# Patient Record
Sex: Female | Born: 1995 | Race: White | Hispanic: No | Marital: Single | Smoking: Former smoker
Health system: Southern US, Community
[De-identification: ages and names within clinical notes are randomized; demographics above are authoritative.]

## PROBLEM LIST (undated history)

## (undated) ENCOUNTER — Inpatient Hospital Stay: Payer: Self-pay

## (undated) DIAGNOSIS — F32A Depression, unspecified: Secondary | ICD-10-CM

## (undated) DIAGNOSIS — D649 Anemia, unspecified: Secondary | ICD-10-CM

## (undated) DIAGNOSIS — F419 Anxiety disorder, unspecified: Secondary | ICD-10-CM

## (undated) DIAGNOSIS — F329 Major depressive disorder, single episode, unspecified: Secondary | ICD-10-CM

## (undated) HISTORY — DX: Anemia, unspecified: D64.9

## (undated) HISTORY — DX: Depression, unspecified: F32.A

## (undated) HISTORY — PX: TONSILLECTOMY AND ADENOIDECTOMY: SHX28

## (undated) HISTORY — DX: Anxiety disorder, unspecified: F41.9

## (undated) HISTORY — DX: Major depressive disorder, single episode, unspecified: F32.9

---
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
## 2013-07-13 ENCOUNTER — Inpatient Hospital Stay: Payer: Self-pay | Admitting: Obstetrics and Gynecology

## 2013-07-13 LAB — PIH PROFILE
Anion Gap: 8 (ref 7–16)
BUN: 7 mg/dL (ref 7–18)
Calcium, Total: 8.9 mg/dL — ABNORMAL LOW (ref 9.0–10.7)
Chloride: 105 mmol/L (ref 98–107)
Co2: 23 mmol/L (ref 21–32)
Creatinine: 0.43 mg/dL — ABNORMAL LOW (ref 0.60–1.30)
EGFR (African American): >60
EGFR (Non-African Amer.): >60
Glucose: 78 mg/dL (ref 65–99)
HCT: 35 % (ref 35.0–47.0)
HGB: 12.3 g/dL (ref 12.0–16.0)
MCH: 31.6 pg (ref 26.0–34.0)
MCHC: 35.2 g/dL (ref 32.0–36.0)
MCV: 90 fL (ref 80–100)
Osmolality: 269 (ref 275–301)
Platelet: 237 10*3/uL (ref 150–440)
Potassium: 3.4 mmol/L — ABNORMAL LOW (ref 3.5–5.1)
RBC: 3.9 10*6/uL (ref 3.80–5.20)
RDW: 12.9 % (ref 11.5–14.5)
SGOT(AST): 19 U/L (ref 0–26)
Sodium: 136 mmol/L (ref 136–145)
Uric Acid: 3.7 mg/dL (ref 3.0–5.8)
WBC: 17.1 10*3/uL — ABNORMAL HIGH (ref 3.6–11.0)

## 2013-07-14 LAB — GC/CHLAMYDIA PROBE AMP

## 2013-07-15 LAB — HEMATOCRIT: HCT: 29.2 % — ABNORMAL LOW (ref 35.0–47.0)

## 2014-09-25 LAB — CBC AND DIFFERENTIAL
HCT: 38 % (ref 36–46)
Hemoglobin: 13 g/dL (ref 12.0–16.0)
Platelets: 286 10*3/uL (ref 150–399)
WBC: 7.4 10^3/mL

## 2014-09-25 LAB — HEPATIC FUNCTION PANEL
ALT: 9 U/L (ref 7–35)
AST: 15 U/L (ref 13–35)

## 2014-09-25 LAB — BASIC METABOLIC PANEL
BUN: 12 mg/dL (ref 4–21)
Creatinine: 0.6 mg/dL (ref 0.5–1.1)
Glucose: 90 mg/dL
Potassium: 4.4 mmol/L (ref 3.4–5.3)
Sodium: 140 mmol/L (ref 137–147)

## 2014-09-25 LAB — TSH: TSH: 1.83 u[IU]/mL (ref 0.41–5.90)

## 2015-02-21 ENCOUNTER — Emergency Department: Payer: Self-pay | Admitting: Emergency Medicine

## 2015-02-24 ENCOUNTER — Emergency Department: Payer: Self-pay | Admitting: Emergency Medicine

## 2015-04-22 NOTE — H&P (Signed)
L&D Evaluation:  History:  HPI 19 yo [facility] presenting with contractions.  No LOF, no VB, +FM.  [facility]   Presents with contractions   Patient's Medical History No Chronic Illness   Patient's Surgical History none   Medications Pre Natal Vitamins   Allergies NKDA   Social History none   Family History Non-Contributory   ROS:  ROS All systems were reviewed.  HEENT, CNS, GI, GU, Respiratory, CV, Renal and Musculoskeletal systems were found to be normal.   Exam:  Vital Signs stable   Urine Protein not completed   General no apparent distress   Mental Status clear   Abdomen gravid, non-tender   Estimated Fetal Weight Average for gestational age   Fetal Position vtx   Edema no edema   Pelvic 2/90/-3 per nursings staff slightly more effaced on recheck   Mebranes Intact   FHT normal rate with no decels   Ucx regular   Impression:  Impression early labor   Plan:  Plan monitor contractions and for cervical change   Comments - morphine sleep 10mg  IM once - recheck in 6-8hrs   Electronic Signatures: Lorrene ReidStaebler, Jamarcus Laduke M (MD)  (Signed 01-Aug-14 15:51)  Authored: L&D Evaluation   Last Updated: 01-Aug-14 15:51 by Lorrene ReidStaebler, Tarik Teixeira M (MD)

## 2015-05-28 ENCOUNTER — Other Ambulatory Visit: Payer: Self-pay | Admitting: Family Medicine

## 2015-05-28 DIAGNOSIS — J309 Allergic rhinitis, unspecified: Secondary | ICD-10-CM | POA: Insufficient documentation

## 2015-05-28 DIAGNOSIS — J302 Other seasonal allergic rhinitis: Secondary | ICD-10-CM

## 2015-05-29 ENCOUNTER — Telehealth: Payer: Self-pay | Admitting: Surgery

## 2015-05-29 NOTE — Telephone Encounter (Signed)
Attempted to call pt to sch. For cyst on back of neck per [facility]. Pt has no voice mail on phone

## 2015-05-30 NOTE — Telephone Encounter (Signed)
I have called pt at [phone] to make appt for Epidermal cyst on back of neck, no answer. I have left a VM. I also called [phone], no answer. I was unable to leave a message at this number, states that the caller is not available at this time.

## 2015-09-25 ENCOUNTER — Ambulatory Visit (INDEPENDENT_AMBULATORY_CARE_PROVIDER_SITE_OTHER): Payer: Medicaid Other | Admitting: Physician Assistant

## 2015-09-25 ENCOUNTER — Encounter: Payer: Self-pay | Admitting: Physician Assistant

## 2015-09-25 VITALS — BP 114/78 | HR 97 | Temp 98.6°F | Resp 18 | Ht 63.0 in | Wt 158.4 lb

## 2015-09-25 DIAGNOSIS — D649 Anemia, unspecified: Secondary | ICD-10-CM | POA: Diagnosis not present

## 2015-09-25 DIAGNOSIS — N898 Other specified noninflammatory disorders of vagina: Secondary | ICD-10-CM | POA: Diagnosis not present

## 2015-09-25 DIAGNOSIS — N760 Acute vaginitis: Secondary | ICD-10-CM

## 2015-09-25 DIAGNOSIS — F410 Panic disorder [episodic paroxysmal anxiety] without agoraphobia: Secondary | ICD-10-CM | POA: Insufficient documentation

## 2015-09-25 DIAGNOSIS — Z0000 Encounter for general adult medical examination without abnormal findings: Secondary | ICD-10-CM

## 2015-09-25 DIAGNOSIS — Z30013 Encounter for initial prescription of injectable contraceptive: Secondary | ICD-10-CM | POA: Diagnosis not present

## 2015-09-25 DIAGNOSIS — R87628 Other abnormal cytological findings on specimens from vagina: Secondary | ICD-10-CM | POA: Diagnosis not present

## 2015-09-25 DIAGNOSIS — R41840 Attention and concentration deficit: Secondary | ICD-10-CM

## 2015-09-25 DIAGNOSIS — Z124 Encounter for screening for malignant neoplasm of cervix: Secondary | ICD-10-CM

## 2015-09-25 DIAGNOSIS — F32A Depression, unspecified: Secondary | ICD-10-CM | POA: Insufficient documentation

## 2015-09-25 DIAGNOSIS — Z0001 Encounter for general adult medical examination with abnormal findings: Secondary | ICD-10-CM | POA: Diagnosis not present

## 2015-09-25 DIAGNOSIS — B9689 Other specified bacterial agents as the cause of diseases classified elsewhere: Secondary | ICD-10-CM

## 2015-09-25 DIAGNOSIS — F419 Anxiety disorder, unspecified: Secondary | ICD-10-CM | POA: Insufficient documentation

## 2015-09-25 DIAGNOSIS — Z1239 Encounter for other screening for malignant neoplasm of breast: Secondary | ICD-10-CM

## 2015-09-25 DIAGNOSIS — K529 Noninfective gastroenteritis and colitis, unspecified: Secondary | ICD-10-CM | POA: Insufficient documentation

## 2015-09-25 DIAGNOSIS — F329 Major depressive disorder, single episode, unspecified: Secondary | ICD-10-CM | POA: Insufficient documentation

## 2015-09-25 LAB — POCT WET PREP (WET MOUNT)

## 2015-09-25 MED ORDER — METRONIDAZOLE 500 MG PO TABS: ORAL_TABLET | ORAL | Status: DC

## 2015-09-25 MED ORDER — MEDROXYPROGESTERONE ACETATE 150 MG/ML IM SUSP
150.0000 mg | Freq: Once | INTRAMUSCULAR | Status: AC
  Administered 2015-09-25: 150 mg via INTRAMUSCULAR

## 2015-09-25 NOTE — Patient Instructions (Signed)
Health Maintenance, Female Adopting a healthy lifestyle and getting preventive care can go a long way to promote health and wellness. Talk with your health care provider about what schedule of regular examinations is right for you. This is a good chance for you to check in with your provider about disease prevention and staying healthy. In between checkups, there are plenty of things you can do on your own. Experts have done a lot of research about which lifestyle changes and preventive measures are most likely to keep you healthy. Ask your health care provider for more information. WEIGHT AND DIET  Eat a healthy diet  Be sure to include plenty of vegetables, fruits, low-fat dairy products, and lean protein.  Do not eat a lot of foods high in solid fats, added sugars, or salt.  Get regular exercise. This is one of the most important things you can do for your health.  Most adults should exercise for at least 150 minutes each week. The exercise should increase your heart rate and make you sweat (moderate-intensity exercise).  Most adults should also do strengthening exercises at least twice a week. This is in addition to the moderate-intensity exercise.  Maintain a healthy weight  Body mass index (BMI) is a measurement that can be used to identify possible weight problems. It estimates body fat based on height and weight. Your health care provider can help determine your BMI and help you achieve or maintain a healthy weight.  For females 28 years of age and older:   A BMI below 18.5 is considered underweight.  A BMI of 18.5 to 24.9 is normal.  A BMI of 25 to 29.9 is considered overweight.  A BMI of 30 and above is considered obese.  Watch levels of cholesterol and blood lipids  You should start having your blood tested for lipids and cholesterol at 21 years of age, then have this test every 5 years.  You may need to have your cholesterol levels checked more often if:  Your lipid  or cholesterol levels are high.  You are older than 21 years of age.  You are at high risk for heart disease.  CANCER SCREENING   Lung Cancer  Lung cancer screening is recommended for adults 75-66 years old who are at high risk for lung cancer because of a history of smoking.  A yearly low-dose CT scan of the lungs is recommended for people who:  Currently smoke.  Have quit within the past 15 years.  Have at least a 30-pack-year history of smoking. A pack year is smoking an average of one pack of cigarettes a day for 1 year.  Yearly screening should continue until it has been 15 years since you quit.  Yearly screening should stop if you develop a health problem that would prevent you from having lung cancer treatment.  Breast Cancer  Practice breast self-awareness. This means understanding how your breasts normally appear and feel.  It also means doing regular breast self-exams. Let your health care provider know about any changes, no matter how small.  If you are in your 20s or 30s, you should have a clinical breast exam (CBE) by a health care provider every 1-3 years as part of a regular health exam.  If you are 25 or older, have a CBE every year. Also consider having a breast X-ray (mammogram) every year.  If you have a family history of breast cancer, talk to your health care provider about genetic screening.  If you  are at high risk for breast cancer, talk to your health care provider about having an MRI and a mammogram every year.  Breast cancer gene (BRCA) assessment is recommended for women who have family members with BRCA-related cancers. BRCA-related cancers include:  Breast.  Ovarian.  Tubal.  Peritoneal cancers.  Results of the assessment will determine the need for genetic counseling and BRCA1 and BRCA2 testing. Cervical Cancer Your health care provider may recommend that you be screened regularly for cancer of the pelvic organs (ovaries, uterus, and  vagina). This screening involves a pelvic examination, including checking for microscopic changes to the surface of your cervix (Pap test). You may be encouraged to have this screening done every 3 years, beginning at age 21.  For women ages 30-65, health care providers may recommend pelvic exams and Pap testing every 3 years, or they may recommend the Pap and pelvic exam, combined with testing for human papilloma virus (HPV), every 5 years. Some types of HPV increase your risk of cervical cancer. Testing for HPV may also be done on women of any age with unclear Pap test results.  Other health care providers may not recommend any screening for nonpregnant women who are considered low risk for pelvic cancer and who do not have symptoms. Ask your health care provider if a screening pelvic exam is right for you.  If you have had past treatment for cervical cancer or a condition that could lead to cancer, you need Pap tests and screening for cancer for at least 20 years after your treatment. If Pap tests have been discontinued, your risk factors (such as having a new sexual partner) need to be reassessed to determine if screening should resume. Some women have medical problems that increase the chance of getting cervical cancer. In these cases, your health care provider may recommend more frequent screening and Pap tests. Colorectal Cancer  This type of cancer can be detected and often prevented.  Routine colorectal cancer screening usually begins at 21 years of age and continues through 21 years of age.  Your health care provider may recommend screening at an earlier age if you have risk factors for colon cancer.  Your health care provider may also recommend using home test kits to check for hidden blood in the stool.  A small camera at the end of a tube can be used to examine your colon directly (sigmoidoscopy or colonoscopy). This is done to check for the earliest forms of colorectal  cancer.  Routine screening usually begins at age 50.  Direct examination of the colon should be repeated every 5-10 years through 21 years of age. However, you may need to be screened more often if early forms of precancerous polyps or small growths are found. Skin Cancer  Check your skin from head to toe regularly.  Tell your health care provider about any new moles or changes in moles, especially if there is a change in a mole's shape or color.  Also tell your health care provider if you have a mole that is larger than the size of a pencil eraser.  Always use sunscreen. Apply sunscreen liberally and repeatedly throughout the day.  Protect yourself by wearing long sleeves, pants, a wide-brimmed hat, and sunglasses whenever you are outside. HEART DISEASE, DIABETES, AND HIGH BLOOD PRESSURE   High blood pressure causes heart disease and increases the risk of stroke. High blood pressure is more likely to develop in:  People who have blood pressure in the high end   of the normal range (130-139/85-89 mm Hg).  People who are overweight or obese.  People who are African American.  If you are 38-23 years of age, have your blood pressure checked every 3-5 years. If you are 61 years of age or older, have your blood pressure checked every year. You should have your blood pressure measured twice--once when you are at a hospital or clinic, and once when you are not at a hospital or clinic. Record the average of the two measurements. To check your blood pressure when you are not at a hospital or clinic, you can use:  An automated blood pressure machine at a pharmacy.  A home blood pressure monitor.  If you are between 45 years and 39 years old, ask your health care provider if you should take aspirin to prevent strokes.  Have regular diabetes screenings. This involves taking a blood sample to check your fasting blood sugar level.  If you are at a normal weight and have a low risk for diabetes,  have this test once every three years after 21 years of age.  If you are overweight and have a high risk for diabetes, consider being tested at a younger age or more often. PREVENTING INFECTION  Hepatitis B  If you have a higher risk for hepatitis B, you should be screened for this virus. You are considered at high risk for hepatitis B if:  You were born in a country where hepatitis B is common. Ask your health care provider which countries are considered high risk.  Your parents were born in a high-risk country, and you have not been immunized against hepatitis B (hepatitis B vaccine).  You have HIV or AIDS.  You use needles to inject street drugs.  You live with someone who has hepatitis B.  You have had sex with someone who has hepatitis B.  You get hemodialysis treatment.  You take certain medicines for conditions, including cancer, organ transplantation, and autoimmune conditions. Hepatitis C  Blood testing is recommended for:  Everyone born from [birth year] through 1967.  Anyone with known risk factors for hepatitis C. Sexually transmitted infections (STIs)  You should be screened for sexually transmitted infections (STIs) including gonorrhea and chlamydia if:  You are sexually active and are younger than 21 years of age.  You are older than 21 years of age and your health care provider tells you that you are at risk for this type of infection.  Your sexual activity has changed since you were last screened and you are at an increased risk for chlamydia or gonorrhea. Ask your health care provider if you are at risk.  If you do not have HIV, but are at risk, it may be recommended that you take a prescription medicine daily to prevent HIV infection. This is called pre-exposure prophylaxis (PrEP). You are considered at risk if:  You are sexually active and do not regularly use condoms or know the HIV status of your partner(s).  You take drugs by injection.  You are sexually  active with a partner who has HIV. Talk with your health care provider about whether you are at high risk of being infected with HIV. If you choose to begin PrEP, you should first be tested for HIV. You should then be tested every 3 months for as long as you are taking PrEP.  PREGNANCY   If you are premenopausal and you may become pregnant, ask your health care provider about preconception counseling.  If you may  become pregnant, take 400 to 800 micrograms (mcg) of folic acid every day.  If you want to prevent pregnancy, talk to your health care provider about birth control (contraception). OSTEOPOROSIS AND MENOPAUSE   Osteoporosis is a disease in which the bones lose minerals and strength with aging. This can result in serious bone fractures. Your risk for osteoporosis can be identified using a bone density scan.  If you are 61 years of age or older, or if you are at risk for osteoporosis and fractures, ask your health care provider if you should be screened.  Ask your health care provider whether you should take a calcium or vitamin D supplement to lower your risk for osteoporosis.  Menopause may have certain physical symptoms and risks.  Hormone replacement therapy may reduce some of these symptoms and risks. Talk to your health care provider about whether hormone replacement therapy is right for you.  HOME CARE INSTRUCTIONS   Schedule regular health, dental, and eye exams.  Stay current with your immunizations.   Do not use any tobacco products including cigarettes, chewing tobacco, or electronic cigarettes.  If you are pregnant, do not drink alcohol.  If you are breastfeeding, limit how much and how often you drink alcohol.  Limit alcohol intake to no more than 1 drink per day for nonpregnant women. One drink equals 12 ounces of beer, 5 ounces of wine, or 1 ounces of hard liquor.  Do not use street drugs.  Do not share needles.  Ask your health care provider for help if  you need support or information about quitting drugs.  Tell your health care provider if you often feel depressed.  Tell your health care provider if you have ever been abused or do not feel safe at home.   This information is not intended to replace advice given to you by your health care provider. Make sure you discuss any questions you have with your health care provider.   Document Released: 06/14/2011 Document Revised: 12/20/2014 Document Reviewed: 10/31/2013 Elsevier Interactive Patient Education [employer].

## 2015-09-25 NOTE — Progress Notes (Signed)
Patient: Felicia Daugherty, Female    DOB: 01/16/1996, 19 y.o.   MRN: [id number] Visit Date: 09/25/2015  Today's Provider: Margaretann LovelessJennifer M Burnette, PA-C   Chief Complaint  Patient presents with  . Annual Exam   Subjective:    Annual physical exam Felicia Daugherty is a 19 y.o. female who presents today for health maintenance and complete physical. She feels well. She reports exercising, walks for 30 minutes 5 times a week, sometimes. She reports she is sleeping poorly, hours of sleep 9, but wakes up a lot through the night like 3 times due to her 21 year old son.  She also states she is noticing having more difficulty concentrating on one task at a time and staying focused.  She also has a complaint today of vaginal discharge. She states she did go to urgent care for this a while back and was given metronidazole for bacterial vaginosis but she only took 3 days of the treatment and discontinued. She states that she feels that she has the same thing going on as she did back then and would like to have that checked as well.  Last PCP:06/26/2010 Colonoscopy: 05/23/2012 Normal    Review of Systems  Constitutional: Negative.   HENT: Positive for ear pain.   Eyes: Negative.   Respiratory: Negative.   Cardiovascular: Positive for palpitations (twice a month depends).  Gastrointestinal: Negative.   Endocrine: Negative.   Genitourinary: Positive for vaginal discharge (white discharge).  Musculoskeletal: Negative.   Allergic/Immunologic: Negative.   Hematological: Negative.   Psychiatric/Behavioral: Positive for sleep disturbance and decreased concentration. The patient is nervous/anxious and is hyperactive.     Social History She  reports that she has been smoking.  She does not have any smokeless tobacco history on file. She reports that she does not drink alcohol or use illicit drugs. Social History   Social History  . Marital Status: Single    Spouse Name: N/A  . Number of Children:  N/A  . Years of Education: N/A   Social History Main Topics  . Smoking status: Current Every Day Smoker -- 0.25 packs/day for 3 years  . Smokeless tobacco: None  . Alcohol Use: No  . Drug Use: No  . Sexual Activity: Not Asked   Other Topics Concern  . None   Social History Narrative  . None    Patient Active Problem List   Diagnosis Date Noted  . 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 Anxiety 09/25/2015  . Clinical depression 09/25/2015  . Ileitis 09/25/2015  . Panic attack 09/25/2015  . Discharge from the vagina 09/25/2015  . Allergic rhinitis 05/28/2015  . Encounter for general adult medical examination without abnormal findings 06/26/2010  . Dysmenorrhea 05/13/2010  . Excess, menstruation 05/13/2010  . Encounter for surveillance of contraceptive pills 05/13/2010    Past Surgical History  Procedure Laterality Date  . Tonsillectomy and adenoidectomy      at age 89    Family History  Family Status  Relation Status Death Age  . Mother Alive   . Father Alive   . Brother Alive     h/o kidney stones  . Maternal Grandmother Alive   . Maternal Grandfather Deceased     died from [place] heart failure  . Paternal Grandfather Deceased     died from an MI   Her family history includes Cancer in her maternal grandmother; Hypertension in her father.    No Known Allergies  Previous Medications  No medications on file    Patient Care Team: Lorie Phenix, MD as PCP - General (Family Medicine)     Objective:   Vitals: BP 114/78 mmHg  Pulse 97  Temp(Src) 98.6 F (37 C) (Oral)  Resp 18  Ht  (1.6 m)  Wt 158 lb 6.4 oz (71.85 kg)  BMI 28.07 kg/m2  LMP 09/12/2015   Physical Exam  Constitutional: She is oriented to person, place, and time. She appears well-developed and well-nourished. No distress.  HENT:  Head: Normocephalic and atraumatic.  Right Ear: Hearing, tympanic membrane, external ear and ear canal normal.  Left Ear: Hearing, tympanic membrane, external  ear and ear canal normal.  Nose: Nose normal.  Mouth/Throat: Uvula is midline, oropharynx is clear and moist and mucous membranes are normal. No oropharyngeal exudate.  Eyes: Conjunctivae and EOM are normal. Pupils are equal, round, and reactive to light. Right eye exhibits no discharge. Left eye exhibits no discharge. No scleral icterus.  Neck: Normal range of motion. Neck supple. No JVD present. Carotid bruit is not present. No tracheal deviation present. No thyromegaly present.  Cardiovascular: Normal rate, regular rhythm, normal heart sounds and intact distal pulses.  Exam reveals no gallop and no friction rub.   No murmur heard. Pulmonary/Chest: Effort normal and breath sounds normal. No respiratory distress. She has no wheezes. She has no rales. She exhibits no tenderness. Right breast exhibits no inverted nipple, no mass, no nipple discharge, no skin change and no tenderness. Left breast exhibits no inverted nipple, no mass, no nipple discharge, no skin change and no tenderness. Breasts are symmetrical.  Abdominal: Soft. Bowel sounds are normal. She exhibits no distension and no mass. There is no tenderness. There is no rebound and no guarding. Hernia confirmed negative in the right inguinal area and confirmed negative in the left inguinal area.  Genitourinary: Rectum normal and uterus normal. No breast swelling, tenderness, discharge or bleeding. Pelvic exam was performed with patient supine. There is no rash, tenderness, lesion or injury on the right labia. There is no rash, tenderness, lesion or injury on the left labia. Cervix exhibits no motion tenderness, no discharge and no friability. Right adnexum displays no mass, no tenderness and no fullness. Left adnexum displays no mass, no tenderness and no fullness. No erythema, tenderness or bleeding in the vagina. No signs of injury around the vagina. Vaginal discharge (thick, white, malodorous) found.  Musculoskeletal: Normal range of motion. She  exhibits no edema or tenderness.  Lymphadenopathy:    She has no cervical adenopathy.       Right: No inguinal adenopathy present.       Left: No inguinal adenopathy present.  Neurological: She is alert and oriented to person, place, and time. She has normal reflexes. No cranial nerve deficit. Coordination normal.  Skin: Skin is warm and dry. No rash noted. She is not diaphoretic.  Psychiatric: She has a normal mood and affect. Her behavior is normal. Judgment and thought content normal.  Vitals reviewed.    Depression Screen No flowsheet data found.    Assessment & Plan:     Routine Health Maintenance and Physical Exam  1. Annual physical exam Physical exam was normal today. I will check labs as below being that she has never had her labs checked per her knowledge. She does have a history of absolute anemia that occurred following the birth of her son 2 years ago due to heavy menstruation. I will recheck her blood count to make sure  she is doing okay. I will follow-up with her pending lab results or in 12 months for repeat physical exam if labs are normal. - Comprehensive metabolic panel - Lipid panel - CBC with Differential/Platelet - TSH  2. Breast cancer screening Breast exam was normal today I did explain to the patient and instruct her on how to do monthly self breast exams she voiced understanding.  3. Cervical cancer screening Exam was normal today. She has never tested HPV positive. She has had the guard is still vaccination series. I will check Pap today per patient's request. We'll follow-up pending Pap results. - Pap IG, CT/NG NAA, and HPV (high risk) ([facility])  4. Vaginal discharge Wet prep today was positive for U clue cells. I do feel this is most likely bacterial vaginosis that has continued from her previous infection where she did not initiate treatment completely. We will restart treatment as below. - POCT Wet Prep [employer])  5. BV (bacterial  vaginosis) See above medical treatment plan for vaginal discharge. Will treat with metronidazole as below. She is to call the office if symptoms fail to improve or worsen. - metroNIDAZOLE (FLAGYL) 500 MG tablet; 1 TABLET BY MOUTH TWICE A DAY MAY CAUSE EXCESSIVE VOMITING IF TAKEN WITH ALCOHOL  Dispense: 14 tablet; Refill: 0  7. Encounter for initial prescription of injectable contraceptive She is interested in contraception. She is currently not using any forms of contraception. She has had the nexplanon device previously and had to have it removed due to discomfort. She is not interested in an IUD. She has previously used Depo-Provera for 2 injections and would like to try this again as it worked well before. Depo-Provera was given in the office today without complication. She is to return in 3 months for her next injection. - medroxyPROGESTERone (DEPO-PROVERA) injection 150 mg; Inject 1 mL (150 mg total) into the muscle once.  8. Lack of concentration She is interested in testing for ADHD. I will refer her to psychology for this testing. If she is positive for ADHD she may return here for treatment. - Ambulatory referral to Psychology  Exercise Activities and Dietary recommendations Goals    None       There is no immunization history on file for this patient.  Health Maintenance  Topic Date Due  . HIV Screening  02/16/2009  . TETANUS/TDAP  02/16/2013  . PAP SMEAR  02/17/2015  . INFLUENZA VACCINE  07/14/2015      Discussed health benefits of physical activity, and encouraged her to engage in regular exercise appropriate for her age and condition.

## 2015-09-27 LAB — CBC WITH DIFFERENTIAL/PLATELET
Basophils Absolute: 0.1 10*3/uL (ref 0.0–0.2)
Basos: 2 %
EOS (ABSOLUTE): 0.8 10*3/uL — ABNORMAL HIGH (ref 0.0–0.4)
Eos: 11 %
Hematocrit: 39.8 % (ref 34.0–46.6)
Hemoglobin: 13 g/dL (ref 11.1–15.9)
Immature Grans (Abs): 0 10*3/uL (ref 0.0–0.1)
Immature Granulocytes: 0 %
Lymphocytes Absolute: 2 10*3/uL (ref 0.7–3.1)
Lymphs: 29 %
MCH: 29.6 pg (ref 26.6–33.0)
MCHC: 32.7 g/dL (ref 31.5–35.7)
MCV: 91 fL (ref 79–97)
Monocytes Absolute: 0.5 10*3/uL (ref 0.1–0.9)
Monocytes: 7 %
Neutrophils Absolute: 3.5 10*3/uL (ref 1.4–7.0)
Neutrophils: 51 %
Platelets: 291 10*3/uL (ref 150–379)
RBC: 4.39 x10E6/uL (ref 3.77–5.28)
RDW: 13.8 % (ref 12.3–15.4)
WBC: 6.8 10*3/uL (ref 3.4–10.8)

## 2015-09-27 LAB — COMPREHENSIVE METABOLIC PANEL
ALT: 13 IU/L (ref 0–32)
AST: 14 IU/L (ref 0–40)
Albumin/Globulin Ratio: 1.5 (ref 1.1–2.5)
Albumin: 4.3 g/dL (ref 3.5–5.5)
Alkaline Phosphatase: 65 IU/L (ref 39–117)
BUN/Creatinine Ratio: 23 — ABNORMAL HIGH (ref 8–20)
BUN: 13 mg/dL (ref 6–20)
Bilirubin Total: 0.5 mg/dL (ref 0.0–1.2)
CO2: 23 mmol/L (ref 18–29)
Calcium: 9.3 mg/dL (ref 8.7–10.2)
Chloride: 98 mmol/L (ref 97–108)
Creatinine, Ser: 0.56 mg/dL — ABNORMAL LOW (ref 0.57–1.00)
GFR calc Af Amer: 154 mL/min/{1.73_m2} (ref >59)
GFR calc non Af Amer: 134 mL/min/{1.73_m2} (ref >59)
Globulin, Total: 2.8 g/dL (ref 1.5–4.5)
Glucose: 82 mg/dL (ref 65–99)
Potassium: 4.3 mmol/L (ref 3.5–5.2)
Sodium: 137 mmol/L (ref 134–144)
Total Protein: 7.1 g/dL (ref 6.0–8.5)

## 2015-09-27 LAB — LIPID PANEL
Chol/HDL Ratio: 3.1 ratio units (ref 0.0–4.4)
Cholesterol, Total: 156 mg/dL (ref 100–199)
HDL: 50 mg/dL (ref >39)
LDL Calculated: 88 mg/dL (ref 0–99)
Triglycerides: 88 mg/dL (ref 0–149)
VLDL Cholesterol Cal: 18 mg/dL (ref 5–40)

## 2015-09-27 LAB — TSH: TSH: 3.25 u[IU]/mL (ref 0.450–4.500)

## 2015-09-29 ENCOUNTER — Telehealth: Payer: Self-pay

## 2015-09-29 ENCOUNTER — Telehealth: Payer: Self-pay | Admitting: Physician Assistant

## 2015-09-29 NOTE — Telephone Encounter (Signed)
-----   Message from Margaretann LovelessJennifer M Burnette, PA-C sent at 09/29/2015  9:27 AM EDT ----- All labs are stable and WNL.  Still awaiting pap results.

## 2015-09-29 NOTE — Telephone Encounter (Signed)
Pt is requesting results from lab work.  [id number]#[phone]/[id number]#[phone]/MW

## 2015-09-29 NOTE — Telephone Encounter (Signed)
Patient advised as directed below.  Thanks,  -Joseline 

## 2015-10-01 ENCOUNTER — Telehealth: Payer: Self-pay

## 2015-10-01 LAB — PAP IG, CT-NG NAA, HPV HIGH-RISK
Chlamydia, Nuc. Acid Amp: NEGATIVE
Gonococcus by Nucleic Acid Amp: NEGATIVE
PAP Smear Comment: 0

## 2015-10-01 LAB — HPV, LOW VOLUME (REFLEX): HPV low volume reflex: NEGATIVE

## 2015-10-01 NOTE — Telephone Encounter (Signed)
-----   Message from Margaretann LovelessJennifer M Burnette, PA-C sent at 10/01/2015  5:13 PM EDT ----- Normal pap, negative HPV and GC/Chlamydia

## 2015-10-01 NOTE — Telephone Encounter (Signed)
Advised pt as directed. Pt verbalized fully understanding.  Thanks,   

## 2015-10-10 ENCOUNTER — Encounter: Payer: Self-pay | Admitting: Emergency Medicine

## 2015-10-10 ENCOUNTER — Emergency Department
Admission: EM | Admit: 2015-10-10 | Discharge: 2015-10-10 | Disposition: A | Discharge status: Home / Self Care | Payer: BC Managed Care – PPO | Attending: Emergency Medicine | Admitting: Emergency Medicine

## 2015-10-10 DIAGNOSIS — Z720 Tobacco use: Secondary | ICD-10-CM | POA: Insufficient documentation

## 2015-10-10 DIAGNOSIS — R221 Localized swelling, mass and lump, neck: Secondary | ICD-10-CM | POA: Diagnosis present

## 2015-10-10 DIAGNOSIS — R591 Generalized enlarged lymph nodes: Secondary | ICD-10-CM

## 2015-10-10 DIAGNOSIS — R590 Localized enlarged lymph nodes: Secondary | ICD-10-CM | POA: Diagnosis not present

## 2015-10-10 MED ORDER — PREDNISONE 10 MG (21) PO TBPK: ORAL_TABLET | ORAL | Status: DC

## 2015-10-10 NOTE — ED Notes (Signed)
States she noticed 2 small areas to back of neck  One of them has been there for about 2 months. Increased pain .

## 2015-10-10 NOTE — ED Provider Notes (Signed)
[facility] Emergency Department Provider Note ____________________________________________  Time seen: Approximately 9:46 AM  I have reviewed the triage vital signs and the nursing notes.   HISTORY  Chief Complaint lumps in neck    HPI Felicia Daugherty is a 19 y.o. female who presents to the emergency department for evaluation of 2 lumps on the back of her neck. She states that the one on the left has been there for approximately 2 months, but the mom the right is new. She's been evaluated by her primary care provider for the initial lump and had blood drawn. She reports that it was "normal." Review of immediate medical family history reveals one grandmother with breast cancer and another grandparent deceased after MRI. There is no immediate family history of other cancers that she knows of. She denies recent URI or other illness. She denies all other symptoms with the exception of the 2 lumps on the back of the neck.   Past Medical History  Diagnosis Date  . Anxiety   . Depression   . Anemia     Patient Active Problem List   Diagnosis Date Noted  . 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 Anxiety 09/25/2015  . Clinical depression 09/25/2015  . Ileitis 09/25/2015  . Panic attack 09/25/2015  . Allergic rhinitis 05/28/2015  . Dysmenorrhea 05/13/2010    Past Surgical History  Procedure Laterality Date  . Tonsillectomy and adenoidectomy      at age 89    Current Outpatient Rx  Name  Route  Sig  Dispense  Refill  . metroNIDAZOLE (FLAGYL) 500 MG tablet      1 TABLET BY MOUTH TWICE A DAY MAY CAUSE EXCESSIVE VOMITING IF TAKEN WITH ALCOHOL   14 tablet   0   . predniSONE (STERAPRED UNI-PAK 21 TAB) 10 MG (21) TBPK tablet      Take 6 tablets on day 1 Take 5 tablets on day 2 Take 4 tablets on day 3 Take 3 tablets on day 4 Take 2 tablets on day 5 Take 1 tablet on day 6   21 tablet   0     Allergies Review of patient's allergies indicates no known  allergies.  Family History  Problem Relation Age of Onset  . Hypertension Father   . Cancer Maternal Grandmother     Breast    Social History Social History  Substance Use Topics  . Smoking status: Current Every Day Smoker -- 0.25 packs/day for 3 years  . Smokeless tobacco: None  . Alcohol Use: No    Review of Systems Constitutional: No fever/chills Eyes: No visual changes. ENT: No sore throat. Cardiovascular: Denies chest pain. Respiratory: Denies shortness of breath. Gastrointestinal: No abdominal pain.  No nausea, no vomiting.  No diarrhea.  No constipation. Genitourinary: Negative for dysuria. Musculoskeletal: Negative for back pain. Negative for neck stiffness Skin: Negative for rash. Neurological: Negative for headaches, focal weakness or numbness.  10-point ROS otherwise negative.  ____________________________________________   PHYSICAL EXAM:  VITAL SIGNS: ED Triage Vitals  Enc Vitals Group     BP 10/10/15 0850 130/77 mmHg     Pulse Rate 10/10/15 0850 88     Resp 10/10/15 0850 20     Temp 10/10/15 0850 98.5 F (36.9 C)     Temp Source 10/10/15 0850 Oral     SpO2 10/10/15 0850 100 %     Weight 10/10/15 0850 158 lb (71.668 kg)     Height 10/10/15 0850 5\' 3"  (1.6 m)  Head Cir --      Peak Flow --      Pain Score 10/10/15 0850 4     Pain Loc --      Pain Edu? --      Excl. in GC? --     Constitutional: Alert and oriented. Well appearing and in no acute distress. Eyes: Conjunctivae are normal. PERRL. EOMI. Head: Atraumatic. Nose: No congestion/rhinnorhea. Mouth/Throat: Mucous membranes are moist.  Oropharynx non-erythematous. Neck: No stridor.  2 small nodules noted 1 each side of the posterior aspect of the neck. They are not raised above the skin and are not erythematous or fluctuant. There is no associated induration. Cardiovascular: Normal rate, regular rhythm. Grossly normal heart sounds.  Good peripheral circulation. Respiratory: Normal  respiratory effort.  No retractions. Lungs CTAB. Gastrointestinal: Soft and nontender. No distention. No abdominal bruits. No CVA tenderness. Musculoskeletal: No lower extremity tenderness nor edema.  No joint effusions. Neurologic:  Normal speech and language. No gross focal neurologic deficits are appreciated. No gait instability. Skin:  Skin is warm, dry and intact. No rash noted. Psychiatric: Mood and affect are normal. Speech and behavior are normal.  ____________________________________________   LABS (all labs ordered are listed, but only abnormal results are displayed)  Labs Reviewed - No data to display ____________________________________________  EKG   ____________________________________________  RADIOLOGY  Not indicated ____________________________________________   PROCEDURES  Procedure(s) performed: None  Critical Care performed: No  ____________________________________________   INITIAL IMPRESSION / ASSESSMENT AND PLAN / ED COURSE  Pertinent labs & imaging results that were available during my care of the patient were reviewed by me and considered in my medical decision making (see chart for details).  Reassurance given to the patient. She was advised to take the prednisone pack as prescribed. She was advised to follow-up with her primary care provider for further evaluation and workup if she does not improve after the tapering dose of prednisone. She was advised to return to the emergency department for symptoms that change or worsen if she is unable to schedule an appointment with her PCP. ____________________________________________   FINAL CLINICAL IMPRESSION(S) / ED DIAGNOSES  Final diagnoses:  Lymphadenopathy of head and neck      Chinita Pester, FNP 10/10/15 1610  Phineas Semen, MD 10/10/15 1146

## 2015-10-10 NOTE — Discharge Instructions (Signed)

## 2015-10-10 NOTE — ED Notes (Signed)
Pt to ed with c/o "lumps in the back of my neck"  Pt states areas have been there for 2 months.

## 2015-10-15 ENCOUNTER — Encounter: Payer: Self-pay | Admitting: Family Medicine

## 2015-10-15 ENCOUNTER — Ambulatory Visit (INDEPENDENT_AMBULATORY_CARE_PROVIDER_SITE_OTHER): Payer: BC Managed Care – PPO | Admitting: Family Medicine

## 2015-10-15 VITALS — BP 112/72 | HR 92 | Temp 98.5°F | Resp 16 | Wt 163.0 lb

## 2015-10-15 DIAGNOSIS — J302 Other seasonal allergic rhinitis: Secondary | ICD-10-CM

## 2015-10-15 DIAGNOSIS — F410 Panic disorder [episodic paroxysmal anxiety] without agoraphobia: Secondary | ICD-10-CM

## 2015-10-15 DIAGNOSIS — F329 Major depressive disorder, single episode, unspecified: Secondary | ICD-10-CM | POA: Diagnosis not present

## 2015-10-15 DIAGNOSIS — F32A Depression, unspecified: Secondary | ICD-10-CM

## 2015-10-15 DIAGNOSIS — F39 Unspecified mood [affective] disorder: Secondary | ICD-10-CM | POA: Diagnosis not present

## 2015-10-15 MED ORDER — FLUTICASONE PROPIONATE 50 MCG/ACT NA SUSP: 2.0000 | Freq: Every day | NASAL | Status: DC

## 2015-10-15 NOTE — Progress Notes (Signed)
Subjective:    Patient ID: Felicia Daugherty, female    DOB: 07/29/1996, 19 y.o.   MRN: [id number]  HPI   Follow up Hospitalization  Patient was admitted to [facility] ED on 10/10/2015 and discharged on same day. She was treated for lymphadenopathy.   Treatment for this included Prednisone. Did not help.  Made her feel horrible.  She reports good compliance with treatment. She reports this condition is Unchanged.   Lymphadenopathy Pt reports she has 2 lumps on the back of her neck. Pt has had the left sided lump x 2 months, and this is unchanged in size. Pt reports she has head/neck pain. The right sided lump appeared about 1 week ago. Pt was prescribed Prednisone by the ED for this problem, without relief.  Saw Jenni. Did not think  Lymph node.   Neck felt like you needed to pop it. And it felt better.    Does have previous history of anxiety. Does know that trust is an issue. Thinks there may be something wrong with her.  May have had surgery next week. Is having a neck lift. Does not like how her neck is configured. Has paid for this.      Review of Systems  Constitutional: Positive for fatigue. Negative for fever, chills, diaphoresis, activity change, appetite change and unexpected weight change.  Respiratory: Negative for cough and shortness of breath.   Cardiovascular: Negative for chest pain, palpitations and leg swelling.  Musculoskeletal: Positive for back pain and neck pain.  Neurological: Positive for headaches.  Hematological: Positive for adenopathy.     Patient Active Problem List   Diagnosis Date Noted  . 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 Anxiety 09/25/2015  . Clinical depression 09/25/2015  . Ileitis 09/25/2015  . Panic attack 09/25/2015  . Allergic rhinitis 05/28/2015  . Dysmenorrhea 05/13/2010   Past Medical History  Diagnosis Date  . Anxiety   . Depression   . Anemia    No current outpatient prescriptions on file prior to visit.   No current  facility-administered medications on file prior to visit.   No Known Allergies Past Surgical History  Procedure Laterality Date  . Tonsillectomy and adenoidectomy      at age 89   Social History   Social History  . Marital Status: Single    Spouse Name: N/A  . Number of Children: N/A  . Years of Education: N/A   Occupational History  . Not on file.   Social History Main Topics  . Smoking status: Former Smoker -- 0.25 packs/day for 3 years    Quit date: 10/01/2015  . Smokeless tobacco: Never Used  . Alcohol Use: Yes     Comment: occasional  . Drug Use: No  . Sexual Activity: Not on file   Other Topics Concern  . Not on file   Social History Narrative   Family History  Problem Relation Age of Onset  . Hypertension Father   . Cancer Maternal Grandmother     Breast  . Hypothyroidism Maternal Grandmother   . Lupus Maternal Grandmother   . Hypothyroidism Mother   . Hypothyroidism Brother   . Hypothyroidism Sister   . Lupus Maternal Aunt       Objective:   Physical Exam  Constitutional: She is oriented to person, place, and time. She appears well-developed and well-nourished.  HENT:  Head: Normocephalic and atraumatic.  Right Ear: External ear normal.  Left Ear: External ear normal.  Mouth/Throat: Oropharynx is clear and moist.  Eyes: Conjunctivae and EOM are normal. Pupils are equal, round, and reactive to light.  Neck: Normal range of motion. Neck supple.  Cardiovascular: Normal rate and regular rhythm.   Pulmonary/Chest: Effort normal and breath sounds normal.  Neurological: She is alert and oriented to person, place, and time.  Psychiatric: She has a normal mood and affect. Her behavior is normal. Judgment and thought content normal.   BP 112/72 mmHg  Pulse 92  Temp(Src) 98.5 F (36.9 C) (Oral)  Resp 16  Wt 163 lb (73.936 kg)  LMP 09/12/2015     Assessment & Plan:  1. Clinical depression Stable  Not currently on medication. Feels more like anxiety.     2. Panic attack Is having anxiety.   Stressed about her healthy.    3. Mood disorder (HCC) Does have mood swings, emotional lability. Is actually spending 6000 dollars on cosmetic procedure next week. Did not think this was a good idea in light of her health concerns and her obsessive behavior around them, with frequent trips to ER and UC for "lumps". Exam normal today. Reassurance given. Will refer to Psychiatry to obtain appropriate diagnosis and treatment. Will need mood stabilizer.  - Ambulatory referral to Psychiatry  4. Other seasonal allergic rhinitis Some ETD. Will treat with Flonase. Patient instructed to call back if condition worsens or does not improve.    - fluticasone (FLONASE) 50 MCG/ACT nasal spray; Place 2 sprays into both nostrils daily.  Dispense: 16 g; Refill: 6  Lorie Phenix, MD

## 2015-10-18 ENCOUNTER — Encounter: Payer: Self-pay | Admitting: Family Medicine

## 2015-10-18 ENCOUNTER — Ambulatory Visit (INDEPENDENT_AMBULATORY_CARE_PROVIDER_SITE_OTHER): Payer: BC Managed Care – PPO | Admitting: Family Medicine

## 2015-10-18 DIAGNOSIS — K1379 Other lesions of oral mucosa: Secondary | ICD-10-CM | POA: Diagnosis not present

## 2015-10-18 DIAGNOSIS — K136 Irritative hyperplasia of oral mucosa: Secondary | ICD-10-CM | POA: Insufficient documentation

## 2015-10-18 NOTE — Patient Instructions (Signed)
Discussed swishing with warm salt water (1/2 teaspoon in 4 oz of water) and 1/4 teaspoon of hydrogen peroxide once or twice a day.

## 2015-10-18 NOTE — Progress Notes (Signed)
Subjective:     Patient ID: Felicia Daugherty, female   DOB: 06/07/1996, [age] y.o.   MRN: [id number]  HPI  Chief Complaint  Patient presents with  . Mouth Lesions    Pt noticed a tender "red spot" on the left side of her cheek.  States it has improved since onset 11/3 with the use of saline rinses. Concerned that she may have a cancer. Denies injury. No use of smokeless tobacco. Accompanied by her boyfriend.   Review of Systems     Objective:   Physical Exam  Constitutional: She appears well-developed and well-nourished. No distress.  HENT:  Left buccal mucosa with a non-elevated, .5 cm area of mild erythema with several punctate areas of hemorrhage.       Assessment:    1. Irritation of oral cavity    Plan:    Discussed use of saline and H202 rinses. Reassured that this should resolve in a short period of time.

## 2015-11-17 ENCOUNTER — Ambulatory Visit: Payer: Medicaid Other | Admitting: Licensed Clinical Social Worker

## 2015-11-26 ENCOUNTER — Ambulatory Visit: Payer: Medicaid Other | Admitting: Psychiatry

## 2015-12-16 ENCOUNTER — Ambulatory Visit: Payer: Medicaid Other | Admitting: Physician Assistant

## 2015-12-16 ENCOUNTER — Ambulatory Visit (INDEPENDENT_AMBULATORY_CARE_PROVIDER_SITE_OTHER): Payer: BC Managed Care – PPO

## 2015-12-16 ENCOUNTER — Encounter: Payer: Self-pay | Admitting: Family Medicine

## 2015-12-16 ENCOUNTER — Encounter: Payer: Self-pay | Admitting: Physician Assistant

## 2015-12-16 VITALS — BP 98/60 | HR 100 | Temp 98.2°F | Resp 16 | Wt 155.2 lb

## 2015-12-16 DIAGNOSIS — A499 Bacterial infection, unspecified: Secondary | ICD-10-CM | POA: Diagnosis not present

## 2015-12-16 DIAGNOSIS — N644 Mastodynia: Secondary | ICD-10-CM

## 2015-12-16 DIAGNOSIS — N760 Acute vaginitis: Secondary | ICD-10-CM

## 2015-12-16 DIAGNOSIS — J0120 Acute ethmoidal sinusitis, unspecified: Secondary | ICD-10-CM | POA: Diagnosis not present

## 2015-12-16 DIAGNOSIS — B9689 Other specified bacterial agents as the cause of diseases classified elsewhere: Secondary | ICD-10-CM

## 2015-12-16 DIAGNOSIS — Z3042 Encounter for surveillance of injectable contraceptive: Secondary | ICD-10-CM | POA: Diagnosis not present

## 2015-12-16 MED ORDER — AMOXICILLIN-POT CLAVULANATE 875-125 MG PO TABS: 1.0000 | ORAL_TABLET | Freq: Two times a day (BID) | ORAL | Status: DC

## 2015-12-16 MED ORDER — METRONIDAZOLE 500 MG PO TABS: 500.0000 mg | ORAL_TABLET | Freq: Two times a day (BID) | ORAL | Status: DC

## 2015-12-16 MED ORDER — MEDROXYPROGESTERONE ACETATE 150 MG/ML IM SUSP
150.0000 mg | Freq: Once | INTRAMUSCULAR | Status: AC
  Administered 2015-12-16: 150 mg via INTRAMUSCULAR

## 2015-12-16 NOTE — Progress Notes (Signed)
Nurse Visit for Depo-Provera. Patient is to return for next injection between Mar 20- Apr 3,2017.

## 2015-12-16 NOTE — Progress Notes (Signed)
Patient: Felicia Daugherty Female    DOB: Feb 05, 1996   19 y.o.   MRN: [id number] Visit Date: 12/16/2015  Today's Provider: Margaretann Loveless, PA-C   Chief Complaint  Patient presents with  . Sinusitis  . soreness of breast   Subjective:    Sinusitis This is a new problem. The current episode started 1 to 4 weeks ago. The problem has been gradually worsening since onset. There has been no fever. Associated symptoms include congestion, coughing, ear pain (a little ), headaches, sinus pressure, sneezing and a sore throat. Pertinent negatives include no chills or shortness of breath.   Breast Tender: Patient was here for her Depo-Provera which it was administered. Patient then stated that wanted to talk to provider about sinusitis and Breast feeling sore. Per patient has been spotting and breast are tender. Doesn't know if it is a side effect from the injection. The reason patient is concerns is because breast cancer runs in the family and is scared that she may develop cancer. Per patient is also taking Phentermine for weight loss has been taking it for three weeks.     No Known Allergies Previous Medications   FLUTICASONE (FLONASE) 50 MCG/ACT NASAL SPRAY    Place 2 sprays into both nostrils daily.    Review of Systems  Constitutional: Negative for fever, chills and fatigue.  HENT: Positive for congestion, ear pain (a little ), postnasal drip, rhinorrhea, sinus pressure, sneezing and sore throat. Negative for tinnitus, trouble swallowing and voice change.   Eyes: Negative.   Respiratory: Positive for cough. Negative for chest tightness, shortness of breath and wheezing.   Cardiovascular: Negative for chest pain.  Gastrointestinal: Negative for nausea, vomiting and abdominal pain.  Neurological: Positive for headaches. Negative for dizziness.  All other systems reviewed and are negative.   Social History  Substance Use Topics  . Smoking status: Former Smoker -- 0.25 packs/day  for 3 years    Quit date: 10/01/2015  . Smokeless tobacco: Never Used  . Alcohol Use: Yes     Comment: occasional   Objective:   BP 98/60 mmHg  Pulse 100  Temp(Src) 98.2 F (36.8 C) (Oral)  Resp 16  Wt 155 lb 3.2 oz (70.398 kg)  SpO2 99%  Physical Exam  Constitutional: She appears well-developed and well-nourished. No distress.  HENT:  Head: Normocephalic and atraumatic.  Right Ear: Hearing, tympanic membrane, external ear and ear canal normal. Tympanic membrane is not erythematous and not bulging. No middle ear effusion.  Left Ear: Hearing, external ear and ear canal normal. Tympanic membrane is not erythematous and not bulging. A middle ear effusion is present.  Nose: Mucosal edema present. No rhinorrhea. Right sinus exhibits no maxillary sinus tenderness and no frontal sinus tenderness. Left sinus exhibits no maxillary sinus tenderness and no frontal sinus tenderness.  Mouth/Throat: Uvula is midline, oropharynx is clear and moist and mucous membranes are normal. No oropharyngeal exudate, posterior oropharyngeal edema or posterior oropharyngeal erythema.  Tenderness over ethmoidal sinuses  Eyes: Conjunctivae are normal. Pupils are equal, round, and reactive to light. Right eye exhibits no discharge. Left eye exhibits no discharge. No scleral icterus.  Neck: Normal range of motion. Neck supple. No tracheal deviation present. No thyromegaly present.  Cardiovascular: Normal rate, regular rhythm and normal heart sounds.  Exam reveals no gallop and no friction rub.   No murmur heard. Pulmonary/Chest: Effort normal and breath sounds normal. No accessory muscle usage or stridor. No respiratory distress.  She has no wheezes. She has no rhonchi. She has no rales. She exhibits no tenderness and no bony tenderness.  Lymphadenopathy:    She has no cervical adenopathy.  Skin: Skin is warm and dry. She is not diaphoretic.  Vitals reviewed.       Assessment & Plan:     1. Acute ethmoidal  sinusitis, recurrence not specified Worsening symptoms over the last 3 weeks without response to over-the-counter medications. I will treat with Augmentin as below. I also advised to make sure to try to get plenty of rest and to stay well-hydrated. She is to call the office if symptoms fail to improve or worsen. - amoxicillin-clavulanate (AUGMENTIN) 875-125 MG tablet; Take 1 tablet by mouth 2 (two) times daily.  Dispense: 20 tablet; Refill: 0  2. BV (bacterial vaginosis) During interview patient states that on her last pelvic exam she was diagnosed with bacterial vaginosis. She did not complete the metronidazole that was given to her and feels that the symptoms never completely went away. She is having similar symptoms to what she had previously. I will treat with Flagyl as below. I did advise her to return to make sure to complete her treatment to make sure that the bacteria is completely cleared. She voiced understanding and states she will try to do better this time. I did discuss with her the bad outcomes packing comment from untreated bacterial vaginosis including PID. She states that she will complete treatment this time. She is to call the office if symptoms fail to improve or worsen. - metroNIDAZOLE (FLAGYL) 500 MG tablet; Take 1 tablet (500 mg total) by mouth 2 (two) times daily.  Dispense: 14 tablet; Refill: 0  3. Breast tenderness in female I do feel her breast tenderness is secondary to beginning Depo-Provera. Today was only her second injection. I did discuss with her that the body goes through a period of adjustment with the new hormones and that this can cause spotting and breast tenderness as well as other symptoms. She voiced understanding. I did advise her however that if she continues to have similar symptoms after the next couple injections that she should definitely let us know so they can be evaluated further.       Margaretann LovelessJennifer M Burnette, PA-C  [facility]

## 2015-12-16 NOTE — Patient Instructions (Signed)

## 2015-12-19 ENCOUNTER — Telehealth: Payer: Self-pay | Admitting: Family Medicine

## 2015-12-19 DIAGNOSIS — J0140 Acute pansinusitis, unspecified: Secondary | ICD-10-CM

## 2015-12-19 MED ORDER — DOXYCYCLINE HYCLATE 100 MG PO TABS: 100.0000 mg | ORAL_TABLET | Freq: Two times a day (BID) | ORAL | Status: DC

## 2015-12-19 NOTE — Telephone Encounter (Signed)
She may stop the Augmentin and will try doxycycline. She is to call the office if this causes upset stomach as well.

## 2015-12-19 NOTE — Telephone Encounter (Signed)
Patient advised as directed below.  Thanks,  -Estoria Geary 

## 2015-12-19 NOTE — Telephone Encounter (Signed)
Pt was in earlier this week and was given an antibiotic.  She says that it is making her stomach hurt and cramp.  Her call back is [phone]  Thanks Barth Kirkseri

## 2016-01-19 ENCOUNTER — Encounter: Payer: Self-pay | Admitting: Physician Assistant

## 2016-01-19 ENCOUNTER — Ambulatory Visit (INDEPENDENT_AMBULATORY_CARE_PROVIDER_SITE_OTHER): Payer: BC Managed Care – PPO | Admitting: Physician Assistant

## 2016-01-19 VITALS — BP 120/70 | HR 110 | Temp 98.2°F | Resp 16 | Wt 152.0 lb

## 2016-01-19 DIAGNOSIS — R221 Localized swelling, mass and lump, neck: Secondary | ICD-10-CM | POA: Diagnosis not present

## 2016-01-19 NOTE — Progress Notes (Signed)
       Patient: Felicia Daugherty Female    DOB: 1996-06-26   19 y.o.   MRN: [id number] Visit Date: 01/19/2016  Today's Provider: Margaretann Loveless, PA-C   Chief Complaint  Patient presents with  . Nodules in Neck   Subjective:    HPI Felicia Daugherty is here concerned about nodules on the right side of her neck. No change in size, not swollen, no redness. It feels like there's a little bit of pressure. No sore throat, not exposed to mono. She states that she noticed it approximately one month ago and is worried that it may be cancer. Is located close to the posterior aspect on the right side of the neck posterior to the sternocleidomastoid. She does have a history of liposuction on her neck.     No Known Allergies Previous Medications   FLUTICASONE (FLONASE) 50 MCG/ACT NASAL SPRAY    Place 2 sprays into both nostrils daily.   OMEGA-3 FATTY ACIDS (OMEGA 3 PO)    Take by mouth daily.   PHENTERMINE 37.5 MG CAPSULE    Take 37.5 mg by mouth every morning.    Review of Systems  Constitutional: Negative for fever and fatigue.  HENT: Positive for sinus pressure. Negative for ear pain, sore throat, trouble swallowing and voice change.   Respiratory: Negative for cough, chest tightness, shortness of breath and wheezing.   Cardiovascular: Negative for chest pain and palpitations.  Gastrointestinal: Negative for nausea, vomiting and abdominal pain.    Social History  Substance Use Topics  . Smoking status: Former Smoker -- 0.25 packs/day for 3 years    Quit date: 10/01/2015  . Smokeless tobacco: Never Used  . Alcohol Use: Yes     Comment: occasional   Objective:   BP 120/70 mmHg  Pulse 110  Temp(Src) 98.2 F (36.8 C) (Oral)  Resp 16  Wt 152 lb (68.947 kg)  Physical Exam  Constitutional: She appears well-developed and well-nourished. No distress.  HENT:  Head: Normocephalic and atraumatic.  Right Ear: Tympanic membrane and external ear normal.  Left Ear: Tympanic membrane and  external ear normal.  Nose: Right sinus exhibits no maxillary sinus tenderness and no frontal sinus tenderness. Left sinus exhibits no maxillary sinus tenderness and no frontal sinus tenderness.  Mouth/Throat: Uvula is midline, oropharynx is clear and moist and mucous membranes are normal. No oropharyngeal exudate, posterior oropharyngeal edema or posterior oropharyngeal erythema.  Neck: Normal range of motion. Neck supple. No tracheal deviation present. No thyromegaly present.    Lymphadenopathy:    She has no cervical adenopathy.  Skin: She is not diaphoretic.  Vitals reviewed.       Assessment & Plan:     1. Mass in neck Palpable, nontender, movable mass located on the right side of the neck. This does feel most consistent with a cyst but due to family history of cancer and her concerns we will get an ultrasound to further evaluate this area to make sure that that is what it is. I will follow-up with her pending the results of the ultrasound. She is to call the office in the meantime if this area gets larger, becomes tender or if she develops any other acute issues, question or concern. - US Soft Tissue Head/Neck; Future       Margaretann Loveless, PA-C  [facility]

## 2016-01-23 ENCOUNTER — Ambulatory Visit
Admission: RE | Admit: 2016-01-23 | Discharge: 2016-01-23 | Disposition: A | Discharge status: Home / Self Care | Payer: BC Managed Care – PPO | Source: Ambulatory Visit | Attending: Physician Assistant | Admitting: Physician Assistant

## 2016-01-23 ENCOUNTER — Telehealth: Payer: Self-pay

## 2016-01-23 DIAGNOSIS — R221 Localized swelling, mass and lump, neck: Secondary | ICD-10-CM | POA: Insufficient documentation

## 2016-01-23 NOTE — Telephone Encounter (Signed)
-----   Message from Margaretann Loveless, [place] sent at 01/23/2016  8:39 AM EST ----- The palpable area in the neck is a normal appearing lymph node.

## 2016-01-23 NOTE — Telephone Encounter (Signed)
Patient advised as directed below.  Thanks,  -Joseline 

## 2016-02-23 ENCOUNTER — Encounter: Payer: Self-pay | Admitting: Physician Assistant

## 2016-02-23 ENCOUNTER — Ambulatory Visit (INDEPENDENT_AMBULATORY_CARE_PROVIDER_SITE_OTHER): Payer: BC Managed Care – PPO | Admitting: Physician Assistant

## 2016-02-23 VITALS — BP 120/70 | HR 92 | Temp 97.8°F | Resp 16 | Wt 152.8 lb

## 2016-02-23 DIAGNOSIS — R221 Localized swelling, mass and lump, neck: Secondary | ICD-10-CM

## 2016-02-23 DIAGNOSIS — T7840XA Allergy, unspecified, initial encounter: Secondary | ICD-10-CM

## 2016-02-23 NOTE — Addendum Note (Signed)
Addended by: Margaretann LovelessBURNETTE, JENNIFER M on: 02/23/2016 04:04 PM   Modules accepted: Level of Service

## 2016-02-23 NOTE — Patient Instructions (Signed)

## 2016-02-23 NOTE — Progress Notes (Signed)
Patient: Felicia Daugherty Female    DOB: [birth year]   20 y.o.   MRN: [id number] Visit Date: 02/23/2016  Today's Provider: Margaretann Loveless, PA-C   Chief Complaint  Patient presents with  . Mass in neck   Subjective:    HPI [name] comes in today with c/o of swollen mass on the right side of her neck. She now feels there is another one on the same side.Patient was seen previously for the same concern an [place] was ordered and it showed Normal appearing Lymph Node.    She also concern about an episode she had Saturday while she was at her mother in law and they were doing putting everything new in the bathroom. She reports that suddenly her chest started burning and to a coughing spell. Her throat was closing with chest tightness. She reports that since then her upper back hurts. Doesn't know if it from coughing so hard at that time. She feels better today. She just wants to know if it was a type of asthma attack. This has never happen to her, it was the first time. She does have a family history of Asthma, her dad and grandmother.     No Known Allergies Previous Medications   FLUTICASONE (FLONASE) 50 MCG/ACT NASAL SPRAY    Place 2 sprays into both nostrils daily.   OMEGA-3 FATTY ACIDS (OMEGA 3 PO)    Take by mouth daily.   PHENTERMINE 37.5 MG CAPSULE    Take 37.5 mg by mouth every morning.    Review of Systems  Constitutional: Positive for fatigue. Negative for fever.  HENT: Negative.   Respiratory: Negative for cough, chest tightness, shortness of breath and wheezing.   Cardiovascular: Negative.   Gastrointestinal: Negative.   Neurological: Negative.     Social History  Substance Use Topics  . Smoking status: Former Smoker -- 0.25 packs/day for 3 years    Quit date: 10/01/2015  . Smokeless tobacco: Never Used  . Alcohol Use: Yes     Comment: occasional   Objective:   BP 120/70 mmHg  Pulse 92  Temp(Src) 97.8 F (36.6 C) (Oral)  Resp 16  Wt 152 lb 12.8 oz (69.31  kg)  SpO2 97%  Physical Exam  Constitutional: She appears well-developed and well-nourished. No distress.  Neck: Normal range of motion. Neck supple. No JVD present. No tracheal deviation present. No thyromegaly present.  Cardiovascular: Normal rate, regular rhythm and normal heart sounds.  Exam reveals no gallop and no friction rub.   No murmur heard. Pulmonary/Chest: Effort normal and breath sounds normal. No respiratory distress. She has no wheezes. She has no rales.  Lymphadenopathy:    She has no cervical adenopathy (normal feeling lymph node on right side of neck).  Skin: She is not diaphoretic.  Vitals reviewed.       Assessment & Plan:     1. Allergic reaction, initial encounter I do feel that she had an allergic reaction/reactive airway episode on Saturday due to fumes from recent construction/remodel in her Grandmother's bathroom.  She has been in there since without complication however, so it must have been something from the initial remodel (states on Saturday they had laid tile for a shower). Advised her to call if it happens again and I will prescribe her an inhaler to have on hand in case it were to keep recurring.  2. Mass in neck Mass on right side of neck is normal feeling lymph node  previously imaged by US and noted to be normal lymph node. She had stated in HPI it was the opposite side, however, when she showed me on exam it was not. It was the same lymph node.       Margaretann LovelessJennifer M Roselee Tayloe, PA-C  [facility]

## 2016-03-08 ENCOUNTER — Encounter: Payer: Self-pay | Admitting: Physician Assistant

## 2016-03-08 ENCOUNTER — Ambulatory Visit (INDEPENDENT_AMBULATORY_CARE_PROVIDER_SITE_OTHER): Payer: BC Managed Care – PPO | Admitting: Physician Assistant

## 2016-03-08 VITALS — BP 120/80 | HR 100 | Temp 98.2°F | Resp 16 | Wt 156.6 lb

## 2016-03-08 DIAGNOSIS — K115 Sialolithiasis: Secondary | ICD-10-CM | POA: Diagnosis not present

## 2016-03-08 DIAGNOSIS — K1120 Sialoadenitis, unspecified: Secondary | ICD-10-CM | POA: Diagnosis not present

## 2016-03-08 MED ORDER — AMOXICILLIN 875 MG PO TABS: 875.0000 mg | ORAL_TABLET | Freq: Two times a day (BID) | ORAL | Status: DC

## 2016-03-08 NOTE — Progress Notes (Signed)
Patient: Felicia Daugherty Female    DOB: 07/24/1996   20 y.o.   MRN: [id number] Visit Date: 03/08/2016  Today's Provider: Margaretann LovelessJennifer M Khiley Lieser, PA-C   Chief Complaint  Patient presents with  . Lump on her Jaw on the right side.   Subjective:    HPI  Junious DresserJanet Lafrance is here with c/o of her right side is swollen and she notice this on Sunday and she swallowed her right side of her tongue was burning and now her right side under her jaw hurts. I feels better when she takes Ibuprofen. She has a little cough and right ear pain and chills, feeling fatigue, and her right arm was hurting on Sunday. No fever, runny nose, sore throat this morning chest pain, or leg swelling.     No Known Allergies Previous Medications   FLUTICASONE (FLONASE) 50 MCG/ACT NASAL SPRAY    Place 2 sprays into both nostrils daily.   OMEGA-3 FATTY ACIDS (OMEGA 3 PO)    Take by mouth daily.   PHENTERMINE 37.5 MG CAPSULE    Take 37.5 mg by mouth every morning. Reported on 03/08/2016    Review of Systems  Constitutional: Positive for chills and fatigue. Negative for fever.  HENT: Positive for ear pain, sneezing, sore throat and trouble swallowing. Negative for ear discharge, postnasal drip and rhinorrhea.   Respiratory: Positive for cough. Negative for chest tightness, shortness of breath and wheezing.   Cardiovascular: Negative for chest pain, palpitations and leg swelling.  Gastrointestinal: Negative for nausea, vomiting and abdominal pain.  Neurological: Negative for dizziness and headaches.    Social History  Substance Use Topics  . Smoking status: Former Smoker -- 0.25 packs/day for 3 years    Quit date: 10/01/2015  . Smokeless tobacco: Never Used  . Alcohol Use: Yes     Comment: occasional   Objective:   BP 120/80 mmHg  Pulse 100  Temp(Src) 98.2 F (36.8 C) (Oral)  Resp 16  Wt 156 lb 9.6 oz (71.033 kg)  Physical Exam  Constitutional: She appears well-developed and well-nourished. No distress.  HENT:   Head: Normocephalic and atraumatic.  Right Ear: Hearing, tympanic membrane, external ear and ear canal normal.  Left Ear: Hearing, tympanic membrane, external ear and ear canal normal.  Nose: Nose normal.  Mouth/Throat: Uvula is midline, oropharynx is clear and moist and mucous membranes are normal. No oropharyngeal exudate, posterior oropharyngeal edema, posterior oropharyngeal erythema or tonsillar abscesses.  Sublingual caruncle and duct on right slightly swollen. No stone palpated.  Eyes: Conjunctivae are normal. Pupils are equal, round, and reactive to light. Right eye exhibits no discharge. Left eye exhibits no discharge. No scleral icterus.  Neck: Trachea normal and normal range of motion. Neck supple. No tracheal deviation present. No thyromegaly present.    Cardiovascular: Normal rate, regular rhythm and normal heart sounds.  Exam reveals no gallop and no friction rub.   No murmur heard. Pulmonary/Chest: Effort normal and breath sounds normal. No stridor. No respiratory distress. She has no wheezes. She has no rales.  Lymphadenopathy:    She has no cervical adenopathy.  Skin: Skin is warm and dry. She is not diaphoretic.  Vitals reviewed.       Assessment & Plan:     1. Sialadenitis Worsening pain and inflammation. I will give amoxicillin as below to help with possible infection of the salivary gland. I also advised her to make sure to stay well-hydrated and to use sour candies  or lemon juice to help with removal of the stone. She voiced understanding and states that she will try. I also advised that she may apply moist heat to the neck to help with any discomfort and swelling. I will also refer her to ENT for further evaluation and treatment of this per patient's request. She is to call the office if symptoms fail to improve or worsen. - amoxicillin (AMOXIL) 875 MG tablet; Take 1 tablet (875 mg total) by mouth 2 (two) times daily.  Dispense: 20 tablet; Refill: 0  2. Salivary  duct stone Suspect salivary stone causing blockage with inflammation and possible infection.        Margaretann Loveless, PA-C  [facility]

## 2016-03-08 NOTE — Patient Instructions (Addendum)
Salivary Gland Infection °A salivary gland infection is an infection in one or more of the glands that produce spit (saliva). You have six major salivary glands. Each gland has a duct that carries saliva into your mouth. Saliva keeps your mouth moist and breaks down the food that you eat. It also helps to prevent tooth decay. °Two salivary glands are located just in front of your ears (parotid). The ducts for these glands open up inside your cheeks, near your back teeth. You also have two glands under your tongue (sublingual) and two glands under your jaw (submandibular). The ducts for these glands open under your tongue. Any salivary gland can become infected. Most infections occur in the parotid glands or submandibular glands. °CAUSES °Salivary glands can be infected by viruses or bacteria. °· The mumps virus is the most common cause of viral salivary gland infections, though mumps is now rare in many areas because of vaccination. °¨ This infection causes swelling in both parotid glands. °¨ Viral infections are more common in children. °· The bacteria that cause salivary gland infections are usually the same bacteria that normally live in your mouth. °¨ A stone can form in a salivary gland and block the flow of saliva. As a result, saliva backs up into the salivary gland. Bacteria may then start to grow behind the blockage and cause infection. °¨ Bacterial infections usually cause pain and swelling on one side of the face. Submandibular gland swelling occurs under the jaw. Parotid swelling occurs in front of the ear. °¨ Bacterial infections are more common in adults. °RISK FACTORS °Children who do not get the MMR (measles, mumps, rubella) vaccine are more likely to get mumps, which can cause a viral salivary gland infection. °Risk factors for bacterial infections include: °· Poor dental care (oral hygiene). °· Smoking. °· Not drinking enough water. °· Having a disease that causes dry mouth and dry eyes (Mikulicz  syndrome or Sjogren syndrome). °SIGNS AND SYMPTOMS °The main sign of salivary gland infection is a swollen salivary gland. This type of inflammation is often called sialadenitis. You may have swelling in front of your ear, under your jaw, or under your tongue. Swelling may get worse when you eat and decrease after you eat. Other signs and symptoms include: °· Pain. °· Tenderness. °· Redness. °· Dry mouth. °· Bad taste in your mouth. °· Difficulty chewing and swallowing. °· Fever. °DIAGNOSIS °Your health care provider may suspect a salivary gland infection based on your signs and symptoms. He or she will also do a physical exam. The health care provider will look and feel inside your mouth to see whether a stone is blocking a salivary gland duct. You may need to see an ear, nose, and throat specialist (ENT or otolaryngologist) for diagnosis and treatment. You may also need to have diagnostic tests, such as: °· An X-ray to check for a stone. °· Other imaging studies to look for an abscess and to rule out other causes of swelling. These tests may include: °¨ Ultrasound. °¨ CT scan. °¨ MRI. °· Culture and sensitivity test. This involves collecting a sample of pus for testing in the lab to see what bacteria grow and what antibiotics they are sensitive to. The testing sample may be: °¨ Swabbed from a salivary gland duct. °¨ Withdrawn from a swollen gland with a needle (aspiration). °TREATMENT °Viral salivary gland infections usually clear up without treatment. Bacterial infections are usually treated with antibiotic medicine. Severe infections that cause difficulty with swallowing may   be treated with an IV antibiotic in the hospital. Other treatments may include:  Probing and widening the salivary duct to allow a stone to pass. In some cases, a thin, flexible scope (endoscope) may be inserted into the duct to find a stone and remove it.  Breaking up a stone using sound waves.  Draining an infected gland (abscess)  with a needle.  In some cases, you may need surgery so your health care provider can:  Remove a stone.  Drain pus from an abscess.  Remove a badly infected gland. HOME CARE INSTRUCTIONS  Take medicines only as directed by your health care provider.  If you were prescribed an antibiotic medicine, finish it all even if you start to feel better.  Follow these instructions every few hours:  Suck on a lemon candy to stimulate the flow of saliva.  Put a warm compress over the gland.  Gently massage the gland.  Drink enough fluid to keep your urine clear or pale yellow.  Rinse your mouth with a mixture of warm water and salt every few hours. To make this mixture, add a pinch of salt to 1 cup of warm water.  Practice good oral hygiene by brushing and flossing your teeth after meals and before you go to bed.  Do not use any tobacco products, including cigarettes, chewing tobacco, or electronic cigarettes. If you need help quitting, ask your health care provider. SEEK MEDICAL CARE IF:  You have pain and swelling in your face, jaw, or mouth after eating.  You have persistent swelling in any of these places:  In front of your ear.  Under your jaw.  Inside your mouth. SEEK IMMEDIATE MEDICAL CARE IF:   You have pain and swelling in your face, jaw, or mouth that are getting worse.  Your pain and swelling make it hard to swallow or breathe.   This information is not intended to replace advice given to you by your health care provider. Make sure you discuss any questions you have with your health care provider.   Document Released: 01/06/2005 Document Revised: 12/20/2014 Document Reviewed: 05/01/2014 Elsevier Interactive Patient Education 2016 Elsevier Inc.  Salivary Stone A salivary stone is a mineral deposit that builds up in the ducts that drain your salivary glands. Most salivary gland stones are made of calcium. When a stone forms, saliva can back up into the gland and cause  painful swelling. Your salivary glands are the glands that produce spit (saliva). You have six major salivary glands. Each gland has a duct that carries saliva into your mouth. Saliva keeps your mouth moist and breaks down the food that you eat. It also helps to prevent tooth decay. Two salivary glands are located just in front of your ears (parotid). The ducts for these glands open up inside your cheeks, near your back teeth. You also have two glands under your tongue (sublingual) and two glands under your jaw (submandibular). The ducts for these glands open under your tongue. A stone can form in any salivary gland. The most common place for a salivary stone to develop is in a submandibular salivary gland. CAUSES Any condition that reduces the flow of saliva may lead to stone formation. It is not known why some people form stones and others do not.  RISK FACTORS You may be more likely to develop a salivary stone if you:  Are female.  Do not drink enough water.  Smoke.  Have high blood pressure.  Have gout.  Have diabetes.  SIGNS AND SYMPTOMS The main sign of a salivary gland stone is sudden swelling of a salivary gland when eating. This usually happens under the jaw on one side. Other signs and symptoms include:  Swelling of the cheek or under the tongue when eating.  Pain in the swollen area.  Trouble chewing or swallowing.  Swelling that goes down after eating. DIAGNOSIS Your health care provider may diagnose a salivary gland stone based on your signs and symptoms. The health care provider will also do a physical exam. In many cases, a stone can be felt in a duct inside your mouth. You may need to see an ear, nose, and throat specialist (ENT or otolaryngologist) for diagnosis and treatment. You may also need to have diagnostic tests. These may include imaging studies to check for a stone, such as:  X-rays.  Ultrasound.  CT scan.  MRI. TREATMENT Home care may be enough to treat  a small stone that is not causing symptoms. Treatment of a stone that is large enough to cause symptoms may include:  Probing and widening the duct to allow the stone to pass.  Inserting a thin, flexible scope (endoscope) into the duct to locate and remove the stone.  Breaking up the stone with sound waves.  Removing the entire salivary gland. HOME CARE INSTRUCTIONS  Drink enough fluid to keep your urine clear or pale yellow.  Follow these instructions every few hours:  Suck on a lemon candy to stimulate the flow of saliva.  Put a hot compress over the gland.  Gently massage the gland.  Do not use any tobacco products, including cigarettes, chewing tobacco, or electronic cigarettes. If you need help quitting, ask your health care provider. SEEK MEDICAL CARE IF:  You have pain and swelling in your face, jaw, or mouth after eating.  You have persistent swelling in any of these places:  In front of your ear.  Under your jaw.  Inside your mouth. SEEK IMMEDIATE MEDICAL CARE IF:  You have pain and swelling in your face, jaw, or mouth that are getting worse.  Your pain and swelling make it hard to swallow or breathe.   This information is not intended to replace advice given to you by your health care provider. Make sure you discuss any questions you have with your health care provider.   Document Released: 01/06/2005 Document Revised: 12/20/2014 Document Reviewed: 05/01/2014 Elsevier Interactive Patient Education [employer].

## 2016-03-09 ENCOUNTER — Telehealth: Payer: Self-pay | Admitting: Physician Assistant

## 2016-03-09 DIAGNOSIS — K1120 Sialoadenitis, unspecified: Secondary | ICD-10-CM

## 2016-03-09 NOTE — Telephone Encounter (Signed)
Informed pt. Emily Drozdowski, CMA  

## 2016-03-09 NOTE — Telephone Encounter (Signed)
Okay to refer? Allene DillonEmily Drozdowski, CMA

## 2016-03-09 NOTE — Telephone Encounter (Signed)
Referral ordered

## 2016-03-09 NOTE — Telephone Encounter (Signed)
Pt would like to get a referral for to ENT for the stone in her salivary gland. Pt stated that it is making her neck swell and she doesn't want to wait a week to see if it comes out on it's own. Please advise. Thanks TNP

## 2016-03-30 ENCOUNTER — Ambulatory Visit (INDEPENDENT_AMBULATORY_CARE_PROVIDER_SITE_OTHER): Payer: BC Managed Care – PPO | Admitting: Physician Assistant

## 2016-03-30 ENCOUNTER — Encounter: Payer: Self-pay | Admitting: Physician Assistant

## 2016-03-30 VITALS — BP 100/70 | HR 102 | Temp 98.2°F | Resp 16 | Wt 152.8 lb

## 2016-03-30 DIAGNOSIS — J302 Other seasonal allergic rhinitis: Secondary | ICD-10-CM | POA: Diagnosis not present

## 2016-03-30 DIAGNOSIS — N390 Urinary tract infection, site not specified: Secondary | ICD-10-CM | POA: Diagnosis not present

## 2016-03-30 LAB — POCT URINALYSIS DIPSTICK
Bilirubin, UA: NEGATIVE
Glucose, UA: NEGATIVE
Ketones, UA: NEGATIVE
Leukocytes, UA: NEGATIVE
Nitrite, UA: NEGATIVE
Protein, UA: NEGATIVE
Spec Grav, UA: 1.015
Urobilinogen, UA: 0.2
pH, UA: 6

## 2016-03-30 MED ORDER — CIPROFLOXACIN HCL 500 MG PO TABS: 500.0000 mg | ORAL_TABLET | Freq: Two times a day (BID) | ORAL | Status: DC

## 2016-03-30 MED ORDER — MONTELUKAST SODIUM 10 MG PO TABS: 10.0000 mg | ORAL_TABLET | Freq: Every day | ORAL | Status: DC

## 2016-03-30 NOTE — Progress Notes (Signed)
Patient: Felicia Daugherty Female    DOB: September 05, 1996   20 y.o.   MRN: [id number] Visit Date: 03/30/2016  Today's Provider: Margaretann Loveless, PA-C   Chief Complaint  Patient presents with  . Urinary Tract Infection  . Cough   Subjective:    Urinary Tract Infection  This is a new problem. The current episode started in the past 7 days (Since Saturday-went to zoo and did not drink enough and held urine as to not have to go wait in line at the restrooms). The problem occurs every urination. The problem has been gradually worsening. The quality of the pain is described as burning. The pain is at a severity of 7/10. The pain is moderate. There has been no fever. She is sexually active. Associated symptoms include chills, flank pain (on her lower back ), frequency and urgency (at night). Pertinent negatives include no discharge, hematuria, nausea, possible pregnancy or vomiting. Associated symptoms comments: Pressure on her bladder. She has tried increased fluids (AZO) for the symptoms. The treatment provided no relief. Her past medical history is significant for recurrent UTIs (she use to have recurrent when she was younger starting at age 34).  Cough This is a new problem. The current episode started 1 to 4 weeks ago. The problem has been unchanged. The problem occurs hourly. The cough is productive of sputum (Yellowish to brpwn color). Associated symptoms include chills, ear pain, headaches, nasal congestion, postnasal drip, rhinorrhea and wheezing (at night). Pertinent negatives include no chest pain, fever, sore throat or shortness of breath. The symptoms are aggravated by lying down and pollens. She has tried OTC cough suppressant (Nyquil and Flonase) for the symptoms. The treatment provided no relief.      No Known Allergies Previous Medications   AMOXICILLIN (AMOXIL) 875 MG TABLET    Take 1 tablet (875 mg total) by mouth 2 (two) times daily.   FLUTICASONE (FLONASE) 50 MCG/ACT NASAL SPRAY     Place 2 sprays into both nostrils daily.   OMEGA-3 FATTY ACIDS (OMEGA 3 PO)    Take by mouth daily. Reported on 03/30/2016   PHENTERMINE 37.5 MG CAPSULE    Take 37.5 mg by mouth every morning. Reported on 03/08/2016    Review of Systems  Constitutional: Positive for chills. Negative for fever.  HENT: Positive for congestion, ear pain, postnasal drip and rhinorrhea. Negative for sinus pressure and sore throat.   Eyes: Positive for itching.  Respiratory: Positive for cough and wheezing (at night). Negative for chest tightness and shortness of breath.   Cardiovascular: Negative for chest pain, palpitations and leg swelling.  Gastrointestinal: Negative for nausea and vomiting.  Genitourinary: Positive for urgency (at night), frequency and flank pain (on her lower back ). Negative for hematuria.  Neurological: Positive for headaches.    Social History  Substance Use Topics  . Smoking status: Former Smoker -- 0.25 packs/day for 3 years    Quit date: 10/01/2015  . Smokeless tobacco: Never Used  . Alcohol Use: Yes     Comment: occasional   Objective:   BP 100/70 mmHg  Pulse 102  Temp(Src) 98.2 F (36.8 C) (Oral)  Resp 16  Wt 152 lb 12.8 oz (69.31 kg)  SpO2 97%  Physical Exam  Constitutional: She is oriented to person, place, and time. She appears well-developed and well-nourished. No distress.  Cardiovascular: Normal rate, regular rhythm and normal heart sounds.  Exam reveals no gallop and no friction rub.  No murmur heard. Pulmonary/Chest: Effort normal and breath sounds normal. No respiratory distress. She has no wheezes. She has no rales.  Abdominal: Soft. Normal appearance and bowel sounds are normal. She exhibits no distension and no mass. There is no hepatosplenomegaly. There is tenderness in the suprapubic area. There is no rebound, no guarding and no CVA tenderness.  Suprapubic pressure  Neurological: She is alert and oriented to person, place, and time.  Skin: Skin is warm  and dry. She is not diaphoretic.        Assessment & Plan:     1. Urinary tract infection without hematuria, site unspecified UA was negative today, but patient states it feels just like all previous UTIs. Will treat empirically with cipro as below. Will send urine for culture and adjust or discontinue antibiotic therapy as directed by C&S. She is to continue to push fluids and use AZO tabs for discomfort. She is to call if symptoms fail to improve.  - POCT urinalysis dipstick - Urine Culture - ciprofloxacin (CIPRO) 500 MG tablet; Take 1 tablet (500 mg total) by mouth 2 (two) times daily.  Dispense: 14 tablet; Refill: 0  2. Other seasonal allergic rhinitis Still having allergic rhinitis and cough secondary to post nasal drainage. She is currently using flonase and taking OTC loratadine. WIll add singulair to see if it offers better coverage. She is to call if symptoms persist or worsen. - montelukast (SINGULAIR) 10 MG tablet; Take 1 tablet (10 mg total) by mouth at bedtime.  Dispense: 30 tablet; Refill: 3       Margaretann LovelessJennifer M Kym Scannell, PA-C  [facility]

## 2016-03-30 NOTE — Patient Instructions (Signed)
Urinary Tract Infection Urinary tract infections (UTIs) can develop anywhere along your urinary tract. Your urinary tract is your body's drainage system for removing wastes and extra water. Your urinary tract includes two kidneys, two ureters, a bladder, and a urethra. Your kidneys are a pair of bean-shaped organs. Each kidney is about the size of your fist. They are located below your ribs, one on each side of your spine. CAUSES Infections are caused by microbes, which are microscopic organisms, including fungi, viruses, and bacteria. These organisms are so small that they can only be seen through a microscope. Bacteria are the microbes that most commonly cause UTIs. SYMPTOMS  Symptoms of UTIs may vary by age and gender of the patient and by the location of the infection. Symptoms in young women typically include a frequent and intense urge to urinate and a painful, burning feeling in the bladder or urethra during urination. Older women and men are more likely to be tired, shaky, and weak and have muscle aches and abdominal pain. A fever may mean the infection is in your kidneys. Other symptoms of a kidney infection include pain in your back or sides below the ribs, nausea, and vomiting. DIAGNOSIS To diagnose a UTI, your caregiver will ask you about your symptoms. Your caregiver will also ask you to provide a urine sample. The urine sample will be tested for bacteria and white blood cells. White blood cells are made by your body to help fight infection. TREATMENT  Typically, UTIs can be treated with medication. Because most UTIs are caused by a bacterial infection, they usually can be treated with the use of antibiotics. The choice of antibiotic and length of treatment depend on your symptoms and the type of bacteria causing your infection. HOME CARE INSTRUCTIONS  If you were prescribed antibiotics, take them exactly as your caregiver instructs you. Finish the medication even if you feel better after  you have only taken some of the medication.  Drink enough water and fluids to keep your urine clear or pale yellow.  Avoid caffeine, tea, and carbonated beverages. They tend to irritate your bladder.  Empty your bladder often. Avoid holding urine for long periods of time.  Empty your bladder before and after sexual intercourse.  After a bowel movement, women should cleanse from front to back. Use each tissue only once. SEEK MEDICAL CARE IF:   You have back pain.  You develop a fever.  Your symptoms do not begin to resolve within 3 days. SEEK IMMEDIATE MEDICAL CARE IF:   You have severe back pain or lower abdominal pain.  You develop chills.  You have nausea or vomiting.  You have continued burning or discomfort with urination. MAKE SURE YOU:   Understand these instructions.  Will watch your condition.  Will get help right away if you are not doing well or get worse.   This information is not intended to replace advice given to you by your health care provider. Make sure you discuss any questions you have with your health care provider.   Document Released: 09/08/2005 Document Revised: 08/20/2015 Document Reviewed: 01/07/2012 Elsevier Interactive Patient Education 2016 Elsevier Inc.  Montelukast oral tablets What is this medicine? MONTELUKAST (mon te LOO kast) is used to prevent and treat the symptoms of asthma. It is also used to treat allergies. Do not use for an acute asthma attack. This medicine may be used for other purposes; ask your health care provider or pharmacist if you have questions. What should I tell  my health care provider before I take this medicine? They need to know if you have any of these conditions: -liver disease -an unusual or allergic reaction to montelukast, other medicines, foods, dyes, or preservatives -pregnant or trying to get pregnant -breast-feeding How should I use this medicine? This medicine should be given by mouth. Follow the  directions on the prescription label. Take this medicine at the same time every day. You may take this medicine with or without meals. Do not chew the tablets. Do not stop taking your medicine unless your doctor tells you to. Talk to your pediatrician regarding the use of this medicine in children. Special care may be needed. While this drug may be prescribed for children as young as 30 years of age for selected conditions, precautions do apply. Overdosage: If you think you have taken too much of this medicine contact a poison control center or emergency room at once. NOTE: This medicine is only for you. Do not share this medicine with others. What if I miss a dose? If you miss a dose, take it as soon as you can. If it is almost time for your next dose, take only that dose. Do not take double or extra doses. What may interact with this medicine? -anti-infectives like rifampin and rifabutin -medicines for diabetes like rosiglitazone and repaglinide -medicines for seizures like phenytoin, phenobarbital, and carbamazepine -paclitaxel This list may not describe all possible interactions. Give your health care provider a list of all the medicines, herbs, non-prescription drugs, or dietary supplements you use. Also tell them if you smoke, drink alcohol, or use illegal drugs. Some items may interact with your medicine. What should I watch for while using this medicine? Visit your doctor or health care professional for regular checks on your progress. Tell your doctor or health care professional if your allergy or asthma symptoms do not improve. Take your medicine even when you do not have symptoms. Do not stop taking any of your medicine(s) unless your doctor tells you to. If you have asthma, talk to your doctor about what to do in an acute asthma attack. Always have your inhaled rescue medicine for asthma attacks with you. Patients and their families should watch for new or worsening thoughts of suicide or  depression. Also watch for sudden changes in feelings such as feeling anxious, agitated, panicky, irritable, hostile, aggressive, impulsive, severely restless, overly excited and hyperactive, or not being able to sleep. Any worsening of mood or thoughts of suicide or dying should be reported to your health care professional right away. What side effects may I notice from receiving this medicine? Side effects that you should report to your doctor or health care professional as soon as possible: -allergic reactions like skin rash or hives, or swelling of the face, lips, or tongue -breathing problems -confusion -dark urine -fever or infection -flu-like symptoms -hallucinations -painful lumps under the skin -pain, tingling, numbness in the hands or feet -sinus pain or swelling -suicidal thoughts or other mood changes -trouble sleeping -unusual bleeding or bruising -yellowing of the eyes or skin Side effects that usually do not require medical attention (report to your doctor or health care professional if they continue or are bothersome): -cough -dizziness -drowsiness -headache -nightmares -stomach upset -stuffy nose This list may not describe all possible side effects. Call your doctor for medical advice about side effects. You may report side effects to FDA at 1-800-FDA-1088. Where should I keep my medicine? Keep out of the reach of children. Store at  room temperature between 15 and 30 degrees C (59 and 86 degrees F). Protect from light and moisture. Keep this medicine in the original bottle. Throw away any unused medicine after the expiration date. NOTE: This sheet is a summary. It may not cover all possible information. If you have questions about this medicine, talk to your doctor, pharmacist, or health care provider.    2016, Elsevier/Gold Standard. (2012-12-19 11:09:07)

## 2016-03-31 NOTE — Addendum Note (Signed)
Addended by: Margaretann LovelessBURNETTE, JENNIFER M on: 03/31/2016 11:05 AM   Modules accepted: K[employer] Brood[employer]Set

## 2016-04-01 ENCOUNTER — Telehealth: Payer: Self-pay

## 2016-04-01 LAB — URINE CULTURE

## 2016-04-01 NOTE — Telephone Encounter (Signed)
Patient advised as directed below. Patient verbalized understanding.  Thanks,  -Joseline 

## 2016-04-01 NOTE — Telephone Encounter (Signed)
-----   Message from Margaretann LovelessJennifer M Burnette, [place]PA-C sent at 04/01/2016  1:41 PM EDT ----- Urine culture was positive for e.coli but is susceptible to ciprofloxacin. Please continue antibiotic until completed.

## 2016-06-01 ENCOUNTER — Encounter: Payer: Self-pay | Admitting: Physician Assistant

## 2016-06-01 ENCOUNTER — Ambulatory Visit (INDEPENDENT_AMBULATORY_CARE_PROVIDER_SITE_OTHER): Payer: BC Managed Care – PPO | Admitting: Physician Assistant

## 2016-06-01 VITALS — BP 120/80 | HR 108 | Temp 97.7°F | Resp 16 | Wt 161.4 lb

## 2016-06-01 DIAGNOSIS — M542 Cervicalgia: Secondary | ICD-10-CM

## 2016-06-01 DIAGNOSIS — M26623 Arthralgia of bilateral temporomandibular joint: Secondary | ICD-10-CM | POA: Diagnosis not present

## 2016-06-01 MED ORDER — METAXALONE 400 MG PO TABS: 400.0000 mg | ORAL_TABLET | Freq: Three times a day (TID) | ORAL | Status: DC

## 2016-06-01 NOTE — Patient Instructions (Signed)
Tension Headache A tension headache is a feeling of pain, pressure, or aching that is often felt over the front and sides of the head. The pain can be dull, or it can feel tight (constricting). Tension headaches are not normally associated with nausea or vomiting, and they do not get worse with physical activity. Tension headaches can last from 30 minutes to several days. This is the most common type of headache. CAUSES The exact cause of this condition is not known. Tension headaches often begin after stress, anxiety, or depression. Other triggers may include:  Alcohol.  Too much caffeine, or caffeine withdrawal.  Respiratory infections, such as colds, flu, or sinus infections.  Dental problems or teeth clenching.  Fatigue.  Holding your head and neck in the same position for a long period of time, such as while using a computer.  Smoking. SYMPTOMS Symptoms of this condition include:  A feeling of pressure around the head.  Dull, aching head pain.  Pain felt over the front and sides of the head.  Tenderness in the muscles of the head, neck, and shoulders. DIAGNOSIS This condition may be diagnosed based on your symptoms and a physical exam. Tests may be done, such as a CT scan or an MRI of your head. These tests may be done if your symptoms are severe or unusual. TREATMENT This condition may be treated with lifestyle changes and medicines to help relieve symptoms. HOME CARE INSTRUCTIONS Managing Pain  Take over-the-counter and prescription medicines only as told by your health care provider.  Lie down in a dark, quiet room when you have a headache.  If directed, apply ice to the head and neck area:  Put ice in a plastic bag.  Place a towel between your skin and the bag.  Leave the ice on for 20 minutes, 2-3 times per day.  Use a heating pad or a hot shower to apply heat to the head and neck area as told by your health care provider. Eating and Drinking  Eat meals on  a regular schedule.  Limit alcohol use.  Decrease your caffeine intake, or stop using caffeine. General Instructions  Keep all follow-up visits as told by your health care provider. This is important.  Keep a headache journal to help find out what may trigger your headaches. For example, write down:  What you eat and drink.  How much sleep you get.  Any change to your diet or medicines.  Try massage or other relaxation techniques.  Limit stress.  Sit up straight, and avoid tensing your muscles.  Do not use tobacco products, including cigarettes, chewing tobacco, or e-cigarettes. If you need help quitting, ask your health care provider.  Exercise regularly as told by your health care provider.  Get 7-9 hours of sleep, or the amount recommended by your health care provider. SEEK MEDICAL CARE IF:  Your symptoms are not helped by medicine.  You have a headache that is different from what you normally experience.  You have nausea or you vomit.  You have a fever. SEEK IMMEDIATE MEDICAL CARE IF:  Your headache becomes severe.  You have repeated vomiting.  You have a stiff neck.  You have a loss of vision.  You have problems with speech.  You have pain in your eye or ear.  You have muscular weakness or loss of muscle control.  You lose your balance or you have trouble walking.  You feel faint or you pass out.  You have confusion.     This information is not intended to replace advice given to you by your health care provider. Make sure you discuss any questions you have with your health care provider.   Document Released: 11/29/2005 Document Revised: 08/20/2015 Document Reviewed: 03/24/2015 Elsevier Interactive Patient Education 2016 Elsevier Inc. Temporomandibular Joint Syndrome Temporomandibular joint (TMJ) syndrome is a condition that affects the joints between your jaw and your skull. The TMJs are located near your ears and allow your jaw to open and close.  These joints and the nearby muscles are involved in all movements of the jaw. People with TMJ syndrome have pain in the area of these joints and muscles. Chewing, biting, or other movements of the jaw can be difficult or painful. TMJ syndrome can be caused by various things. In many cases, the condition is mild and goes away within a few weeks. For some people, the condition can become a long-term problem. CAUSES Possible causes of TMJ syndrome include:  Grinding your teeth or clenching your jaw. Some people do this when they are under stress.  Arthritis.  Injury to the jaw.  Head or neck injury.  Teeth or dentures that are not aligned well. In some cases, the cause of TMJ syndrome may not be known. SIGNS AND SYMPTOMS The most common symptom is an aching pain on the side of the head in the area of the TMJ. Other symptoms may include:  Pain when moving your jaw, such as when chewing or biting.  Being unable to open your jaw all the way.  Making a clicking sound when you open your mouth.  Headache.  Earache.  Neck or shoulder pain. DIAGNOSIS Diagnosis can usually be made based on your symptoms, your medical history, and a physical exam. Your health care provider may check the range of motion of your jaw. Imaging tests, such as X-rays or an MRI, are sometimes done. You may need to see your dentist to determine if your teeth and jaw are lined up correctly. TREATMENT TMJ syndrome often goes away on its own. If treatment is needed, the options may include:  Eating soft foods and applying ice or heat.  Medicines to relieve pain or inflammation.  Medicines to relax the muscles.  A splint, bite plate, or mouthpiece to prevent teeth grinding or jaw clenching.  Relaxation techniques or counseling to help reduce stress.  Transcutaneous electrical nerve stimulation (TENS). This helps to relieve pain by applying an electrical current through the skin.  Acupuncture. This is sometimes  helpful to relieve pain.  Jaw surgery. This is rarely needed. HOME CARE INSTRUCTIONS  Take medicines only as directed by your health care provider.  Eat a soft diet if you are having trouble chewing.  Apply ice to the painful area.  Put ice in a plastic bag.  Place a towel between your skin and the bag.  Leave the ice on for 20 minutes, 2-3 times a day.  Apply a warm compress to the painful area as directed.  Massage your jaw area and perform any jaw stretching exercises as recommended by your health care provider.  If you were given a mouthpiece or bite plate, wear it as directed.  Avoid foods that require a lot of chewing. Do not chew gum.  Keep all follow-up visits as directed by your health care provider. This is important. SEEK MEDICAL CARE IF:  You are having trouble eating.  You have new or worsening symptoms. SEEK IMMEDIATE MEDICAL CARE IF:  Your jaw locks open or closed.  This information is not intended to replace advice given to you by your health care provider. Make sure you discuss any questions you have with your health care provider.   Document Released: 08/24/2001 Document Revised: 12/20/2014 Document Reviewed: 07/04/2014 Elsevier Interactive Patient Education [employer].

## 2016-06-01 NOTE — Progress Notes (Signed)
Patient: Felicia Daugherty Female    DOB: Apr 20, 1996   20 y.o.   MRN: [id number] Visit Date: 06/01/2016  Today's Provider: Margaretann Loveless, PA-C   Chief Complaint  Patient presents with  . Adenopathy    Swollen lymp node   Subjective:    HPI Patient is here with c/o of swollen lymph node on the left side. She has pain under her ear and radiates toward her jaw. She notice the swelling the since last week. She also reports that on the week of [facility] she went to the urgent care with ear pain on the left side and also with swelling on the lymph node and was treated with amoxil and it got better.   She complains of left sided jaw pain, sometimes on right side also. Reports clicking and popping. This is always associated with the ear pain.   Also reports neck pain over occipital region that radiates into the neck and over the scalp and to the left ear. No injury.   She reports that she went to the ENT when she was referred.      No Known Allergies Current Meds  Medication Sig  . phentermine 37.5 MG capsule Take 37.5 mg by mouth every morning. Reported on 03/08/2016    Review of Systems  Constitutional: Positive for chills. Negative for fever and fatigue.  HENT: Positive for congestion and sinus pressure. Negative for hearing loss and tinnitus.   Respiratory: Negative for cough, chest tightness and shortness of breath.   Cardiovascular: Negative for chest pain, palpitations and leg swelling.  Gastrointestinal: Negative for nausea and abdominal pain.  Musculoskeletal: Positive for neck pain.    Social History  Substance Use Topics  . Smoking status: Former Smoker -- 0.25 packs/day for 3 years    Quit date: 10/01/2015  . Smokeless tobacco: Never Used  . Alcohol Use: Yes     Comment: occasional   Objective:   BP 120/80 mmHg  Pulse 108  Temp(Src) 97.7 F (36.5 C) (Oral)  Resp 16  Wt 161 lb 6.4 oz (73.211 kg)  Physical Exam  Constitutional: She appears  well-developed and well-nourished. No distress.  HENT:  Head: Normocephalic and atraumatic.  Right Ear: Hearing, tympanic membrane, external ear and ear canal normal.  Left Ear: Hearing, tympanic membrane, external ear and ear canal normal.  Nose: Right sinus exhibits no maxillary sinus tenderness and no frontal sinus tenderness. Left sinus exhibits no maxillary sinus tenderness and no frontal sinus tenderness.  Mouth/Throat: Uvula is midline, oropharynx is clear and moist and mucous membranes are normal. No oropharyngeal exudate, posterior oropharyngeal edema or posterior oropharyngeal erythema.  Eyes: Conjunctivae are normal. Pupils are equal, round, and reactive to light. Right eye exhibits no discharge. Left eye exhibits no discharge. No scleral icterus.  Neck: Normal range of motion. Neck supple. Muscular tenderness present. No spinous process tenderness present. No rigidity. No tracheal deviation, no edema, no erythema and normal range of motion present. No thyromegaly present.  Cardiovascular: Normal rate, regular rhythm and normal heart sounds.  Exam reveals no gallop and no friction rub.   No murmur heard. Pulmonary/Chest: Effort normal and breath sounds normal. No stridor. No respiratory distress. She has no wheezes. She has no rales.  Lymphadenopathy:    She has no cervical adenopathy.  Skin: Skin is warm and dry. She is not diaphoretic.  Vitals reviewed.       Assessment & Plan:     1. Cervicalgia  I will give her a muscle relaxer as below to see if that helps with muscle tension and spasm. Also will refer to PT for neck pain and TMJ issues. Also advised her to follow up with her dentist as well to make sure she does not need a night guard or other TMJ treatment options. She agrees. She is to call if symptoms worsen or fail to improve. - Ambulatory referral to Physical Therapy - metaxalone (SKELAXIN) 400 MG tablet; Take 1 tablet (400 mg total) by mouth 3 (three) times daily.   Dispense: 60 tablet; Refill: 0  2. Bilateral temporomandibular joint pain See above medical treatment plan. - Ambulatory referral to Physical Therapy       Margaretann LovelessJennifer M Mirayah Wren, PA-C  [facility]

## 2016-06-02 DIAGNOSIS — M542 Cervicalgia: Secondary | ICD-10-CM | POA: Insufficient documentation

## 2016-06-02 DIAGNOSIS — M26623 Arthralgia of bilateral temporomandibular joint: Secondary | ICD-10-CM | POA: Insufficient documentation

## 2016-06-09 ENCOUNTER — Ambulatory Visit: Payer: BC Managed Care – PPO | Admitting: Physical Therapy

## 2016-06-09 ENCOUNTER — Ambulatory Visit: Payer: BC Managed Care – PPO | Attending: Physician Assistant | Admitting: Physical Therapy

## 2016-09-10 ENCOUNTER — Ambulatory Visit (INDEPENDENT_AMBULATORY_CARE_PROVIDER_SITE_OTHER): Payer: Medicaid Other | Admitting: Physician Assistant

## 2016-09-10 VITALS — BP 118/72 | HR 84 | Temp 98.8°F | Resp 16 | Wt 176.0 lb

## 2016-09-10 DIAGNOSIS — N898 Other specified noninflammatory disorders of vagina: Secondary | ICD-10-CM

## 2016-09-10 DIAGNOSIS — N3090 Cystitis, unspecified without hematuria: Secondary | ICD-10-CM | POA: Diagnosis not present

## 2016-09-10 DIAGNOSIS — N9489 Other specified conditions associated with female genital organs and menstrual cycle: Secondary | ICD-10-CM | POA: Diagnosis not present

## 2016-09-10 LAB — POCT URINALYSIS DIPSTICK
Bilirubin, UA: NEGATIVE
Glucose, UA: NEGATIVE
Ketones, UA: NEGATIVE
Spec Grav, UA: 1.02
Urobilinogen, UA: 0.2
pH, UA: 7.5

## 2016-09-10 NOTE — Patient Instructions (Signed)

## 2016-09-10 NOTE — Progress Notes (Signed)
Patient: Felicia Daugherty Female    DOB: [birth year]   20 y.o.   MRN: [id number] Visit Date: 09/10/2016  Today's Provider: Trey Sailors, PA-C   Chief Complaint  Patient presents with  . Urinary Tract Infection    Started 3-4 days ago; has been placed on Bactrim DS.    Subjective:    Urinary Tract Infection   This is a new problem. The current episode started in the past 7 days. The problem occurs every urination. The problem has been unchanged. There is no history of pyelonephritis. Associated symptoms include chills, hematuria and urgency. Pertinent negatives include no discharge, frequency, nausea, sweats or vomiting. Her past medical history is significant for recurrent UTIs.   Patient reports that she visited urgent care this morning where they did a urinalysis and diagnosed her with a UTI. They have her Bactrim 1 DS tabled BID x 3 days. They also told her that she had protein in her urine and needed to follow up with her primary care to rule out a more serious issue.   The patient also complains of a "funny vaginal odor" and states that she normal gets BV when she gets a UTI.  No Known Allergies   Current Outpatient Prescriptions:  .  fluticasone (FLONASE) 50 MCG/ACT nasal spray, Place 2 sprays into both nostrils daily., Disp: 16 g, Rfl: 6 .  montelukast (SINGULAIR) 10 MG tablet, Take 1 tablet (10 mg total) by mouth at bedtime. (Patient not taking: Reported on 09/10/2016), Disp: 30 tablet, Rfl: 3 .  Omega-3 Fatty Acids (OMEGA 3 PO), Take by mouth daily. Reported on 06/01/2016, Disp: , Rfl:  .  phentermine 37.5 MG capsule, Take 37.5 mg by mouth every morning. Reported on 03/08/2016, Disp: , Rfl:   Review of Systems  Constitutional: Positive for chills and fatigue. Negative for activity change, appetite change, diaphoresis, fever and unexpected weight change.  Respiratory: Negative.   Cardiovascular: Negative.   Gastrointestinal: Positive for abdominal pain. Negative for  abdominal distention, anal bleeding, blood in stool, constipation, diarrhea, nausea, rectal pain and vomiting.  Genitourinary: Positive for difficulty urinating, dysuria, hematuria and urgency. Negative for frequency, menstrual problem, vaginal bleeding, vaginal discharge and vaginal pain.       Positive for vaginal odor.   Musculoskeletal: Positive for back pain (Left sided low back pain.).    Social History  Substance Use Topics  . Smoking status: Former Smoker    Packs/day: 0.25    Years: 3.00    Quit date: 10/01/2015  . Smokeless tobacco: Never Used  . Alcohol use Yes     Comment: occasional   Objective:   BP 118/72 (BP Location: Left Arm, Patient Position: Sitting, Cuff Size: Normal)   Pulse 84   Temp 98.8 F (37.1 C) (Oral)   Resp 16   Wt 176 lb (79.8 kg)   BMI 31.18 kg/m   Physical Exam  Constitutional: She is oriented to person, place, and time. She appears well-developed and well-nourished. No distress.  Cardiovascular: Normal rate and regular rhythm.   Pulmonary/Chest: Effort normal and breath sounds normal. No respiratory distress.  Abdominal: Soft. Bowel sounds are normal. There is tenderness in the suprapubic area. There is no CVA tenderness.  Neurological: She is alert and oriented to person, place, and time.  Skin: Skin is warm and dry. She is not diaphoretic.  Psychiatric: She has a normal mood and affect. Her behavior is normal.  Assessment & Plan:     Cystitis - Plan: POCT Urinalysis Dipstick  Vaginal odor  Cystitis  Urinalysis done in office today indicated uncomplicated cystitis. Counseled patient that in the context of her urinary tract infection, it is not unusual to see some protein in the urine and this doesn't necessarily indicate a more serious issue. Offered patient BUN and Cr to check kidney function which she declined. We have decided to have her follow up in one week to redip her urine. Instructed patient to complete her  Bactrim.  Vaginal Odor  Told patient that I would need to do a pelvic exam in order to investigate for Bv. Due to patient's current discomfort, she deferred pelvic exam to a later point if her symptoms persisted.   The entirety of the information documented in the History of Present Illness, Review of Systems and Physical Exam were personally obtained by me. Portions of this information were initially documented by Kavin Leech, CMA and reviewed by me for thoroughness and accuracy.    Patient Instructions  Urinary Tract Infection Urinary tract infections (UTIs) can develop anywhere along your urinary tract. Your urinary tract is your body's drainage system for removing wastes and extra water. Your urinary tract includes two kidneys, two ureters, a bladder, and a urethra. Your kidneys are a pair of bean-shaped organs. Each kidney is about the size of your fist. They are located below your ribs, one on each side of your spine. CAUSES Infections are caused by microbes, which are microscopic organisms, including fungi, viruses, and bacteria. These organisms are so small that they can only be seen through a microscope. Bacteria are the microbes that most commonly cause UTIs. SYMPTOMS  Symptoms of UTIs may vary by age and gender of the patient and by the location of the infection. Symptoms in young women typically include a frequent and intense urge to urinate and a painful, burning feeling in the bladder or urethra during urination. Older women and men are more likely to be tired, shaky, and weak and have muscle aches and abdominal pain. A fever may mean the infection is in your kidneys. Other symptoms of a kidney infection include pain in your back or sides below the ribs, nausea, and vomiting. DIAGNOSIS To diagnose a UTI, your caregiver will ask you about your symptoms. Your caregiver will also ask you to provide a urine sample. The urine sample will be tested for bacteria and white blood cells. White  blood cells are made by your body to help fight infection. TREATMENT  Typically, UTIs can be treated with medication. Because most UTIs are caused by a bacterial infection, they usually can be treated with the use of antibiotics. The choice of antibiotic and length of treatment depend on your symptoms and the type of bacteria causing your infection. HOME CARE INSTRUCTIONS  If you were prescribed antibiotics, take them exactly as your caregiver instructs you. Finish the medication even if you feel better after you have only taken some of the medication.  Drink enough water and fluids to keep your urine clear or pale yellow.  Avoid caffeine, tea, and carbonated beverages. They tend to irritate your bladder.  Empty your bladder often. Avoid holding urine for long periods of time.  Empty your bladder before and after sexual intercourse.  After a bowel movement, women should cleanse from front to back. Use each tissue only once. SEEK MEDICAL CARE IF:   You have back pain.  You develop a fever.  Your symptoms do  not begin to resolve within 3 days. SEEK IMMEDIATE MEDICAL CARE IF:   You have severe back pain or lower abdominal pain.  You develop chills.  You have nausea or vomiting.  You have continued burning or discomfort with urination. MAKE SURE YOU:   Understand these instructions.  Will watch your condition.  Will get help right away if you are not doing well or get worse.   This information is not intended to replace advice given to you by your health care provider. Make sure you discuss any questions you have with your health care provider.   Document Released: 09/08/2005 Document Revised: 08/20/2015 Document Reviewed: 01/07/2012 Elsevier Interactive Patient Education 2016 [employer]        Trey SailorsAdriana M Pollak, PA-C  [facility]

## 2016-09-17 ENCOUNTER — Ambulatory Visit: Payer: Self-pay | Admitting: Family Medicine

## 2016-09-27 ENCOUNTER — Encounter: Payer: Medicaid Other | Admitting: Physician Assistant

## 2016-09-27 ENCOUNTER — Encounter: Payer: Self-pay | Admitting: Physician Assistant

## 2016-09-27 NOTE — Progress Notes (Deleted)
Patient: Felicia Daugherty, Female    DOB: 01-26-1996, 20 y.o.   MRN: [id number] Visit Date: 09/27/2016  Today's Provider: Margaretann Loveless, PA-C   No chief complaint on file.  Subjective:    Annual physical exam Felicia Daugherty is a 20 y.o. female who presents today for health maintenance and complete physical. She feels well. She reports exercising ***. She reports she is sleeping {DESC; WELL/FAIRLY WELL/POORLY:18703}.  -----------------------------------------------------------------   Review of Systems  Constitutional: Negative.   HENT: Negative.   Eyes: Negative.   Respiratory: Negative.   Cardiovascular: Negative.   Gastrointestinal: Negative.   Endocrine: Negative.   Genitourinary: Negative.   Musculoskeletal: Negative.   Skin: Negative.   Allergic/Immunologic: Negative.   Neurological: Negative.   Hematological: Negative.   Psychiatric/Behavioral: Negative.     Social History      She  reports that she quit smoking about a year ago. She has a 0.75 pack-year smoking history. She has never used smokeless tobacco. She reports that she drinks alcohol. She reports that she does not use drugs.       Social History   Social History  . Marital status: Single    Spouse name: N/A  . Number of children: N/A  . Years of education: N/A   Social History Main Topics  . Smoking status: Former Smoker    Packs/day: 0.25    Years: 3.00    Quit date: 10/01/2015  . Smokeless tobacco: Never Used  . Alcohol use Yes     Comment: occasional  . Drug use: No  . Sexual activity: Not Asked   Other Topics Concern  . None   Social History Narrative  . None    Past Medical History:  Diagnosis Date  . Anemia   . Anxiety   . Depression      Patient Active Problem List   Diagnosis Date Noted  . Cervicalgia 06/02/2016  . Bilateral temporomandibular joint pain 06/02/2016  . Irritation of oral cavity 10/18/2015  . Mood disorder (HCC) 10/15/2015  . Absolute anemia  09/25/2015  . Anxiety 09/25/2015  . Clinical depression 09/25/2015  . Ileitis 09/25/2015  . Panic attack 09/25/2015  . Allergic rhinitis 05/28/2015  . Dysmenorrhea 05/13/2010    Past Surgical History:  Procedure Laterality Date  . TONSILLECTOMY AND ADENOIDECTOMY     at age 18    Family History        Family Status  Relation Status  . Father Alive  . Maternal Grandmother Alive  . Mother Alive  . Brother Alive   h/o kidney stones  . Maternal Grandfather Deceased   died from [place] heart failure  . Paternal Grandfather Deceased   died from an MI        Her family history includes Cancer in her maternal grandmother; Diabetes in her maternal grandfather and maternal uncle; Hypertension in her father; Hypothyroidism in her brother, maternal grandmother, and mother; Lupus in her maternal aunt and maternal grandmother.    No Known Allergies  No outpatient prescriptions have been marked as taking for the 09/27/16 encounter (Appointment) with Margaretann Loveless, PA-C.    Patient Care Team: Margaretann Loveless, PA-C as PCP - General (Family Medicine)     Objective:   Vitals: There were no vitals taken for this visit.   Physical Exam   Depression Screen No flowsheet data found.    Assessment & Plan:     Routine Health Maintenance and Physical Exam  Exercise Activities and Dietary recommendations Goals    None      Immunization History  Administered Date(s) Administered  . Meningococcal Conjugate 06/26/2010    Health Maintenance  Topic Date Due  . HIV Screening  02/16/2009  . TETANUS/TDAP  02/16/2013  . INFLUENZA VACCINE  07/13/2016  . PAP SMEAR  09/24/2018      Discussed health benefits of physical activity, and encouraged her to engage in regular exercise appropriate for her age and condition.    --------------------------------------------------------------------    Margaretann LovelessJennifer M Burnette, PA-C  [facility]

## 2016-09-28 ENCOUNTER — Ambulatory Visit: Payer: Self-pay | Admitting: Family Medicine

## 2016-12-13 NOTE — L&D Delivery Note (Signed)
Delivery Note At 7:19 AM a viable female was delivered via Vaginal, Spontaneous Delivery (Presentation: vtx LOA ;  ).  APGAR: 8, 9; weight  .   Placenta status intact , .  Cord:3 v TRUE KNOT and loose nuchal cord  with the following complications: . Not done  Anesthesia:  cle Episiotomy: None Lacerations: 1st degree;Perineal Suture Repair: 3.0 vicryl Est. Blood Loss (mL):  150 cc  Mom to postpartum.  Baby to Couplet care / Skin to Skin.  Felicia Daugherty 08/28/2017, 7:41 AM

## 2016-12-22 ENCOUNTER — Encounter: Payer: Self-pay | Admitting: Physician Assistant

## 2016-12-22 ENCOUNTER — Ambulatory Visit (INDEPENDENT_AMBULATORY_CARE_PROVIDER_SITE_OTHER): Payer: Medicaid Other | Admitting: Physician Assistant

## 2016-12-22 VITALS — BP 142/68 | HR 88 | Temp 98.4°F | Resp 16 | Wt 183.0 lb

## 2016-12-22 DIAGNOSIS — Z3201 Encounter for pregnancy test, result positive: Secondary | ICD-10-CM

## 2016-12-22 DIAGNOSIS — N926 Irregular menstruation, unspecified: Secondary | ICD-10-CM

## 2016-12-22 LAB — POCT URINE PREGNANCY: Preg Test, Ur: POSITIVE — AB

## 2016-12-22 NOTE — Patient Instructions (Signed)

## 2016-12-22 NOTE — Progress Notes (Signed)
Patient: Felicia Daugherty Female    DOB: 12-20-1995   21 y.o.   MRN: [id number] Visit Date: 12/22/2016  Today's Provider: Trey Sailors, PA-C   No chief complaint on file.  Subjective:    HPI   Patient is a 21 y/o female presenting today with irregular menses with concern for pregnancy. She is actively trying to get pregnant with her fiance. The first day of her last period was 11/16/2016. She is having fatigue, nausea, and breast tenderness. No vaginal bleeding, abdominal cramping.  Her symptoms are complicated by the fact that she has recently started HCG injections for weight loss with [facility] in [place]. She has had two injections, the most recent being Sunday 12/19/2016.  She is also taking Phentermine 37.5 mg daily, but has not taken that today.  [phone] (m) - Patient's mobile number     No Known Allergies   Current Outpatient Prescriptions:  .  fluticasone (FLONASE) 50 MCG/ACT nasal spray, Place 2 sprays into both nostrils daily., Disp: 16 g, Rfl: 6 .  montelukast (SINGULAIR) 10 MG tablet, Take 1 tablet (10 mg total) by mouth at bedtime. (Patient not taking: Reported on 09/10/2016), Disp: 30 tablet, Rfl: 3 .  Omega-3 Fatty Acids (OMEGA 3 PO), Take by mouth daily. Reported on 06/01/2016, Disp: , Rfl:  .  phentermine 37.5 MG capsule, Take 37.5 mg by mouth every morning. Reported on 03/08/2016, Disp: , Rfl:   Review of Systems  Constitutional: Positive for fatigue. Negative for activity change, appetite change, chills, diaphoresis, fever and unexpected weight change.  Gastrointestinal: Positive for nausea. Negative for abdominal distention, abdominal pain, anal bleeding, blood in stool, constipation, diarrhea, rectal pain and vomiting.  Genitourinary: Positive for frequency. Negative for decreased urine volume, difficulty urinating, dyspareunia, dysuria, enuresis, flank pain, genital sores, hematuria, menstrual problem, pelvic pain, urgency, vaginal  bleeding, vaginal discharge and vaginal pain.    Social History  Substance Use Topics  . Smoking status: Former Smoker    Packs/day: 0.25    Years: 3.00    Quit date: 10/01/2015  . Smokeless tobacco: Never Used  . Alcohol use Yes     Comment: occasional   Objective:   BP (!) 142/68 (BP Location: Right Arm, Patient Position: Sitting, Cuff Size: Large)   Pulse 88   Temp 98.4 F (36.9 C) (Oral)   Resp 16   Wt 183 lb (83 kg)   LMP 11/16/2016   BMI 32.42 kg/m   Physical Exam  Constitutional: She is oriented to person, place, and time. She appears well-developed and well-nourished.  Cardiovascular: Normal rate and regular rhythm.   Pulmonary/Chest: Effort normal and breath sounds normal.  Abdominal: Soft.  Neurological: She is alert and oriented to person, place, and time.  Skin: Skin is warm and dry.  Psychiatric: She has a normal mood and affect. Her behavior is normal.        Assessment & Plan:     1. Irregular menses  Unsure if positive urine pregnancy test represents true pregnancy or interference from HCG injections. Will get labs as evaluated below and refer to OBGYN for ultrasound. Will treat as true pregnancy. Have instructed patient to stop HCG injections and also her phentermine. Advised against alcohol and smoking, and instructed to begin taking prenatal vitamins.  - POCT urine pregnancy - Beta HCG, Quant - Ambulatory referral to Obstetrics / Gynecology  2. Positive urine pregnancy test  See above.  - Ambulatory referral  to Obstetrics / Gynecology  There are no Patient Instructions on file for this visit.  The entirety of the information documented in the History of Present Illness, Review of Systems and Physical Exam were personally obtained by me. Portions of this information were initially documented by Kavin LeechLaura Samyah Bilbo, CMA and reviewed by me for thoroughness and accuracy.   Return if symptoms worsen or fail to improve.           Trey SailorsAdriana M Pollak,  PA-C  [facility]

## 2016-12-23 LAB — BETA HCG QUANT (REF LAB): hCG Quant: 1498 m[IU]/mL

## 2016-12-27 ENCOUNTER — Telehealth: Payer: Self-pay | Admitting: Physician Assistant

## 2016-12-27 ENCOUNTER — Other Ambulatory Visit: Payer: Self-pay | Admitting: Physician Assistant

## 2016-12-27 DIAGNOSIS — Z3201 Encounter for pregnancy test, result positive: Secondary | ICD-10-CM

## 2016-12-27 NOTE — Progress Notes (Signed)
Felicia Daugherty has informed me that I have to order the ultrasound to confirm patient is pregnant before they will take patient, since she has taken HCG injections. I have ordered a pelvic and transvaginal ultrasound to confirm pregnancy. Please inform patient that she should be alerted of scheduling shortly.

## 2016-12-27 NOTE — Telephone Encounter (Signed)
[facility] states they will need an ultrasound done to make sure pt is truly pregnant before making an appointment.If pt is not pregnant they will still see her for her irregular periods

## 2016-12-27 NOTE — Telephone Encounter (Signed)
Have ordered appropriate ultrasounds for patient.

## 2016-12-27 NOTE — Progress Notes (Signed)
Hecter, fiance, advised of below. He is on DPR-[id number]

## 2016-12-28 ENCOUNTER — Other Ambulatory Visit: Payer: Self-pay | Admitting: Physician Assistant

## 2016-12-28 NOTE — Telephone Encounter (Signed)
Please change pelvic ultrasound complete to US OB less than 14 weeks [id number].This is what ultrasound department states is usually done along with the transvaginal OB.Medicaid would not approve the pelvic ultrasound complete,Thanks

## 2016-12-28 NOTE — Telephone Encounter (Signed)
Ordered US OB < 14 weeks. Thank you.

## 2016-12-31 ENCOUNTER — Ambulatory Visit
Admission: RE | Admit: 2016-12-31 | Discharge: 2016-12-31 | Disposition: A | Discharge status: Home / Self Care | Payer: Medicaid Other | Source: Ambulatory Visit | Attending: Physician Assistant | Admitting: Physician Assistant

## 2016-12-31 DIAGNOSIS — Z3A01 Less than 8 weeks gestation of pregnancy: Secondary | ICD-10-CM | POA: Diagnosis not present

## 2016-12-31 DIAGNOSIS — Z3201 Encounter for pregnancy test, result positive: Secondary | ICD-10-CM | POA: Diagnosis not present

## 2017-01-03 NOTE — Telephone Encounter (Signed)
-----   Message from Trey SailorsAdriana M Pollak, [place]PA-C sent at 12/31/2016  4:44 PM EST ----- Ultrasound shows single intrauterine pregnancy at [id number]. Will work on [facility] getting into [facility].

## 2017-01-03 NOTE — Telephone Encounter (Signed)
Pt advised.   Thanks,   -Laura  

## 2017-01-17 ENCOUNTER — Other Ambulatory Visit: Payer: Self-pay | Admitting: Obstetrics and Gynecology

## 2017-01-17 DIAGNOSIS — Z369 Encounter for antenatal screening, unspecified: Secondary | ICD-10-CM

## 2017-02-07 ENCOUNTER — Other Ambulatory Visit: Payer: Self-pay | Admitting: *Deleted

## 2017-02-07 ENCOUNTER — Ambulatory Visit
Admission: RE | Admit: 2017-02-07 | Discharge: 2017-02-07 | Disposition: A | Discharge status: Home / Self Care | Payer: Medicaid Other | Source: Ambulatory Visit | Attending: Obstetrics and Gynecology | Admitting: Obstetrics and Gynecology

## 2017-02-07 ENCOUNTER — Encounter: Payer: Self-pay | Admitting: *Deleted

## 2017-02-07 ENCOUNTER — Ambulatory Visit
Admission: RE | Admit: 2017-02-07 | Discharge: 2017-02-07 | Disposition: A | Discharge status: Home / Self Care | Payer: Medicaid Other | Source: Ambulatory Visit | Attending: Maternal and Fetal Medicine | Admitting: Maternal and Fetal Medicine

## 2017-02-07 DIAGNOSIS — Z3A11 11 weeks gestation of pregnancy: Secondary | ICD-10-CM | POA: Insufficient documentation

## 2017-02-07 DIAGNOSIS — Z3201 Encounter for pregnancy test, result positive: Secondary | ICD-10-CM

## 2017-02-07 DIAGNOSIS — Z369 Encounter for antenatal screening, unspecified: Secondary | ICD-10-CM

## 2017-02-07 NOTE — Progress Notes (Addendum)
Mar Daring Length of Consultation: 30 minutes   Felicia Daugherty  was referred to [place] for genetic counseling to review prenatal screening and testing options.  This note summarizes the information we discussed.    We offered the following routine screening tests for this pregnancy:  First trimester screening, which includes nuchal translucency ultrasound screen and first trimester maternal serum marker screening.  The nuchal translucency has approximately an 80% detection rate for Down syndrome and can be positive for other chromosome abnormalities as well as congenital heart defects.  When combined with a maternal serum marker screening, the detection rate is up to 90% for Down syndrome and up to 97% for trisomy 18.     Maternal serum marker screening, a blood test that measures pregnancy proteins, can provide risk assessments for Down syndrome, trisomy 18, and open neural tube defects (spina bifida, anencephaly). Because it does not directly examine the fetus, it cannot positively diagnose or rule out these problems.  Targeted ultrasound uses high frequency sound waves to create an image of the developing fetus.  An ultrasound is often recommended as a routine means of evaluating the pregnancy.  It is also used to screen for fetal anatomy problems (for example, a heart defect) that might be suggestive of a chromosomal or other abnormality.   Should these screening tests indicate an increased concern, then the following additional testing options would be offered:  The chorionic villus sampling procedure is available for first trimester chromosome analysis.  This involves the withdrawal of a small amount of chorionic villi (tissue from the developing placenta).  Risk of pregnancy loss is estimated to be approximately 1 in 200 to 1 in 100 (0.5 to 1%).  There is approximately a 1% (1 in 100) chance that the CVS chromosome results will be unclear.  Chorionic  villi cannot be tested for neural tube defects.     Amniocentesis involves the removal of a small amount of amniotic fluid from the sac surrounding the fetus with the use of a thin needle inserted through the maternal abdomen and uterus.  Ultrasound guidance is used throughout the procedure.  Fetal cells from amniotic fluid are directly evaluated and > 99.5% of chromosome problems and > 98% of open neural tube defects can be detected. This procedure is generally performed after the 15th week of pregnancy.  The main risks to this procedure include complications leading to miscarriage in less than 1 in 200 cases (0.5%).  As another option for information if the pregnancy is suspected to be an an increased chance for certain chromosome conditions, we also reviewed the availability of cell free fetal DNA testing from maternal blood to determine whether or not the baby may have either Down syndrome, trisomy 36, or trisomy 76.  This test utilizes a maternal blood sample and DNA sequencing technology to isolate circulating cell free fetal DNA from maternal plasma.  The fetal DNA can then be analyzed for DNA sequences that are derived from the three most common chromosomes involved in aneuploidy, chromosomes 13, 18, and 21.  If the overall amount of DNA is greater than the expected level for any of these chromosomes, aneuploidy is suspected.  While we do not consider it a replacement for invasive testing and karyotype analysis, a negative result from this testing would be reassuring, though not a guarantee of a normal chromosome complement for the baby.  An abnormal result is certainly suggestive of an abnormal chromosome complement, though we would still  recommend CVS or amniocentesis to confirm any findings from this testing.  Cystic Fibrosis and Spinal Muscular Atrophy (SMA) screening were also discussed with the patient. Both conditions are recessive, which means that both parents must be carriers in order to have  a child with the disease.  Cystic fibrosis (CF) is one of the most common genetic conditions in persons of Caucasian ancestry.  This condition occurs in approximately 1 in 2,500 Caucasian persons and results in thickened secretions in the lungs, digestive, and reproductive systems.  For a baby to be at risk for having CF, both of the parents must be carriers for this condition.  Approximately 1 in 7225 Caucasian persons is a carrier for CF.  Current carrier testing looks for the most common mutations in the gene for CF and can detect approximately 90% of carriers in the Caucasian population.  This means that the carrier screening can greatly reduce, but cannot eliminate, the chance for an individual to have a child with CF.  If an individual is found to be a carrier for CF, then carrier testing would be available for the partner. As part of [place] [place] newborn screening profile, all babies born in the state of [place] will have a two-tier screening process.  Specimens are first tested to determine the concentration of immunoreactive trypsinogen (IRT).  The top 5% of specimens with the highest IRT values then undergo DNA testing using a panel of over 40 common CF mutations. SMA is a neurodegenerative disorder that leads to atrophy of skeletal muscle and overall weakness.  This condition is also more prevalent in the Caucasian population, with 1 in 40-1 in 60 persons being a carrier and 1 in 6,000-1 in 10,000 children being affected.  There are multiple forms of the disease, with some causing death in infancy to other forms with survival into adulthood.  The genetics of SMA is complex, but carrier screening can detect up to 95% of carriers in the Caucasian population.  Similar to CF, a negative result can greatly reduce, but cannot eliminate, the chance to have a child with SMA.  We obtained a detailed family history and pregnancy history.  The family history was reported to be unremarkable for birth  defects, developmental differences, recurrent pregnancy loss or known chromosome abnormalities.  Felicia Daugherty stated that this was her second pregnancy.  She and her partner have a healthy 23 year old son.  She reported no complications or exposures that would be expected to increase the risk for birth defects.  After consideration of the options, Felicia Daugherty elected to proceed with first trimester screening, CF and SMA carrier testing.  An ultrasound was performed at the time of the visit.  The gestational age was consistent with 11 weeks, 6 days.  Fetal anatomy could not be assessed due to early gestational age.  Please refer to the ultrasound report for details of that study.  Felicia Daugherty was encouraged to call with questions or concerns.  We can be contacted at [phone].    Cherly Andersoneborah F. Wells, MS, CGC  I was immediately available and supervising. Camelia PhenesBrenna L Jeremy Ditullio, MD [facility]

## 2017-02-10 ENCOUNTER — Telehealth: Payer: Self-pay | Admitting: Obstetrics and Gynecology

## 2017-02-10 NOTE — Telephone Encounter (Signed)
Ms. Katrinka BlazingSmith  elected to undergo First Trimester screening on 02/07/2017.  To review, first trimester screening, includes nuchal translucency ultrasound screen and/or first trimester maternal serum marker screening.  The nuchal translucency has approximately an 80% detection rate for Down syndrome and can be positive for other chromosome abnormalities as well as heart defects.  When combined with a maternal serum marker screening, the detection rate is up to 90% for Down syndrome and up to 97% for trisomy 13 and 18.     The results of the First Trimester Nuchal Translucency and Biochemical Screening were within normal range.  The risk for Down syndrome is now estimated to be 1 in 1,067.  The risk for Trisomy 13/18 is 1 in >10,000.  Should more definitive information be desired, we would offer amniocentesis.  Because we do not yet know the effectiveness of combined first and second trimester screening, we do not recommend a maternal serum screen to assess the chance for chromosome conditions.  However, if screening for neural tube defects is desired, maternal serum screening for AFP only can be performed between 15 and [redacted] weeks gestation.    Ms. Katrinka BlazingSmith also elected to have carrier testing for CF and SMA, which are still pending.  Cherly Andersoneborah F. Pritesh Sobecki, MS, CGC

## 2017-02-14 LAB — CYSTIC FIBROSIS GENE TEST

## 2017-02-14 IMAGING — CT CT NECK WITH CONTRAST
3 of 4 series · 13 of 33 positions shown, 16 images · IV contrast (omnipaque)
Comparison: None.

CLINICAL DATA: 19-year-old female with several week history of sore
throat. He wisdom teeth were removed in [REDACTED]. Patient did not
finish her antibiotics.

EXAM:
CT NECK WITH CONTRAST
TECHNIQUE: Multidetector CT imaging of the neck was performed using the
standard protocol following the bolus administration of intravenous
contrast.
CONTRAST:  75 mL Omnipaque 300 administered intravenously

[Series 2: axial neck · axial · 0.51mm/px · z∈[+38,+200]mm · 5 of 123 slices shown, 7 images]
[im 21/123  soft-tissue]
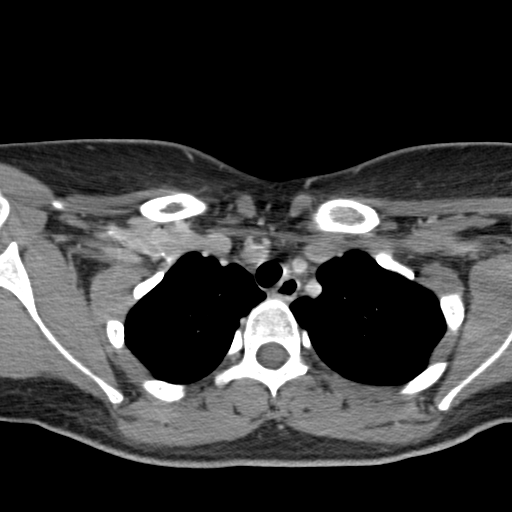
[im 21/123  bone]
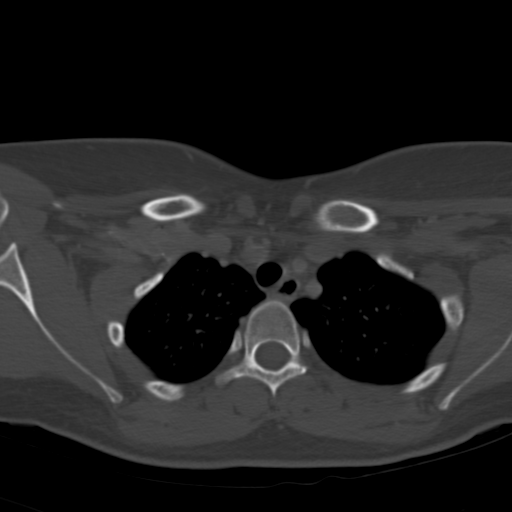
[im 41/123  bone]
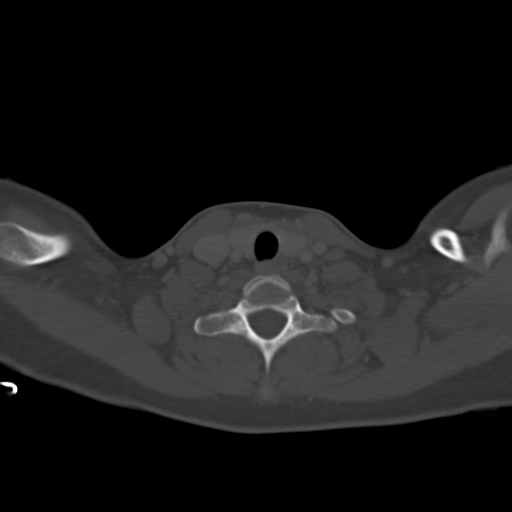
[im 62/123  bone]
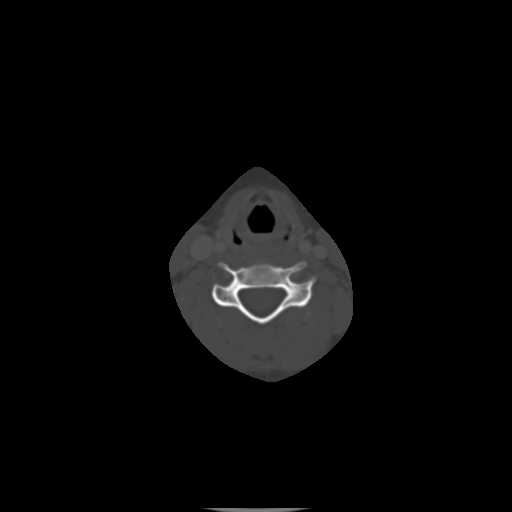
[im 82/123  bone]
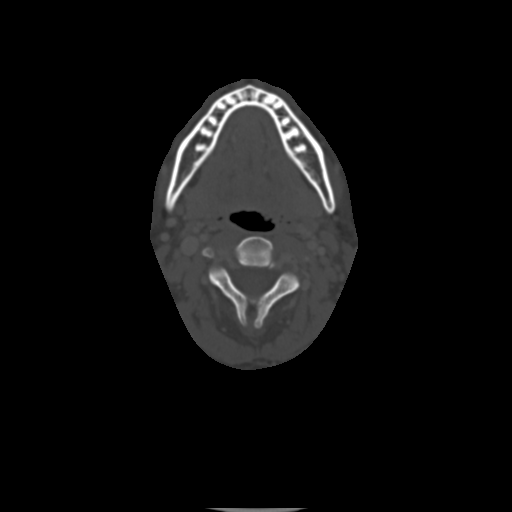
[im 102/123  soft-tissue]
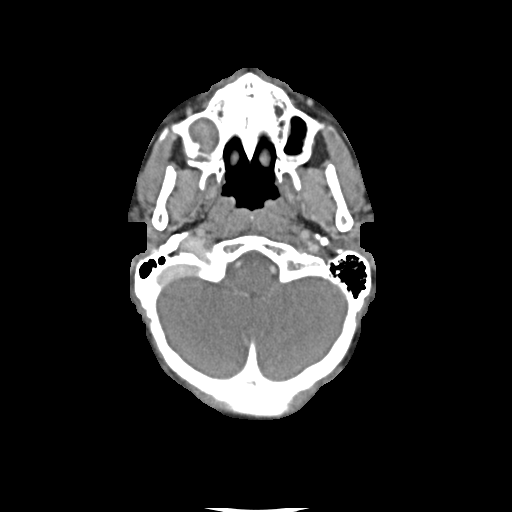
[im 102/123  bone]
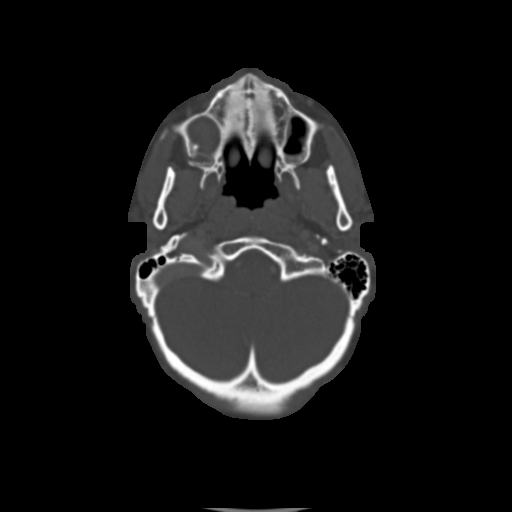

[Series 4: sag neck · sagittal · 0.45mm/px · 5 of 79 slices shown, 6 images]
[im 27/79  bone]
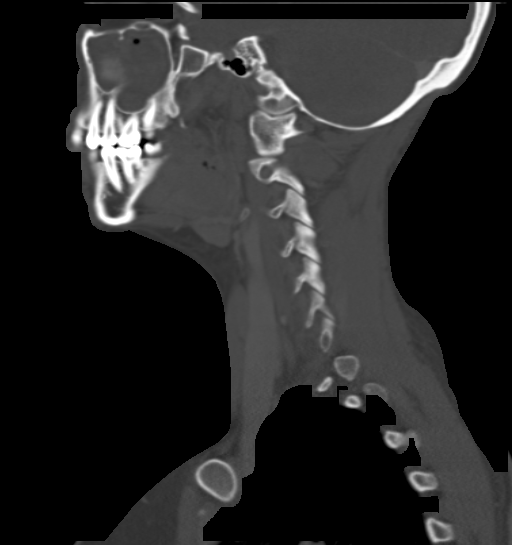
[im 33/79  bone]
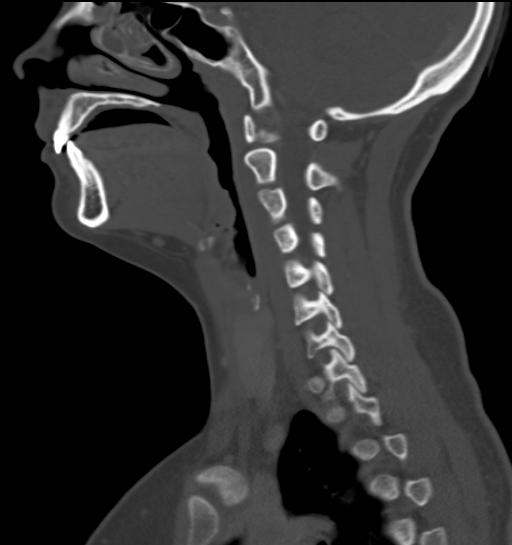
[im 40/79  soft-tissue]
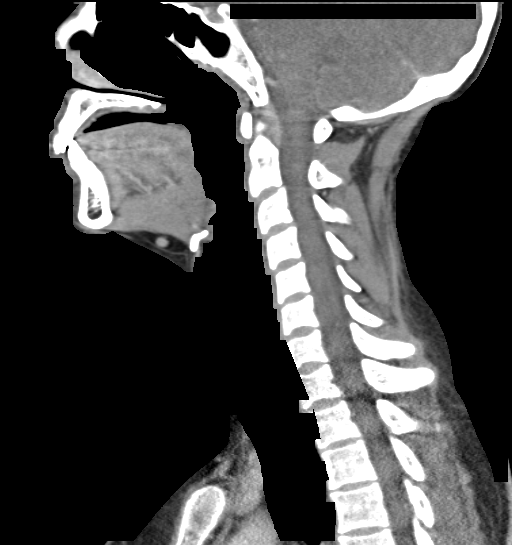
[im 40/79  bone]
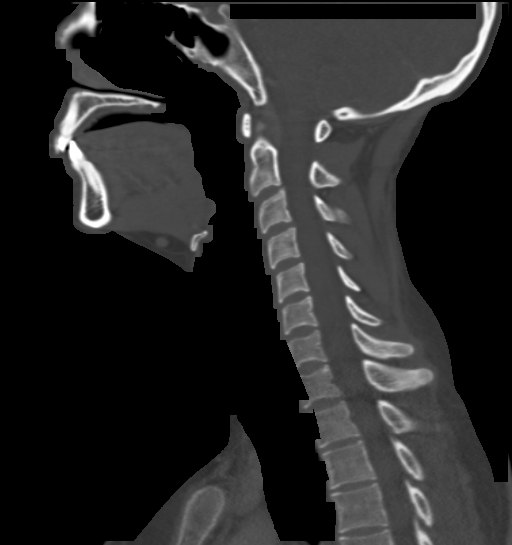
[im 46/79  bone]
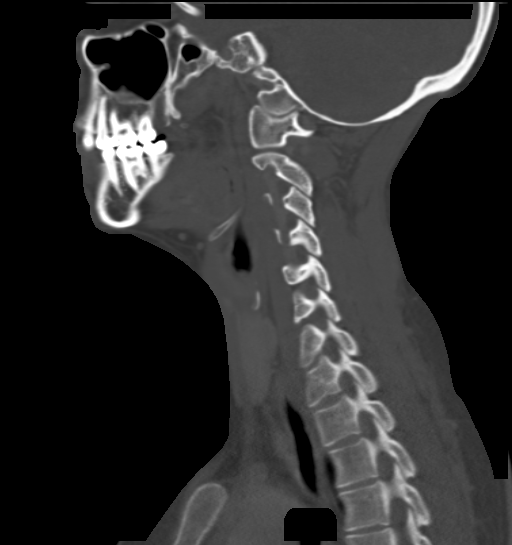
[im 53/79  bone]
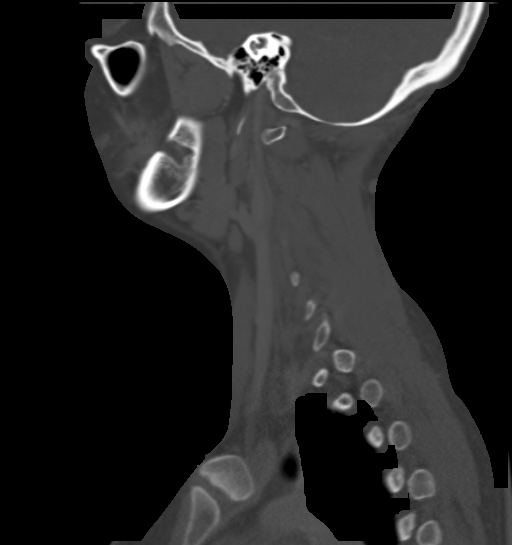

[Series 5: cor neck · coronal · 0.32mm/px · 3 of 116 slices shown]
[im 24/116  bone]
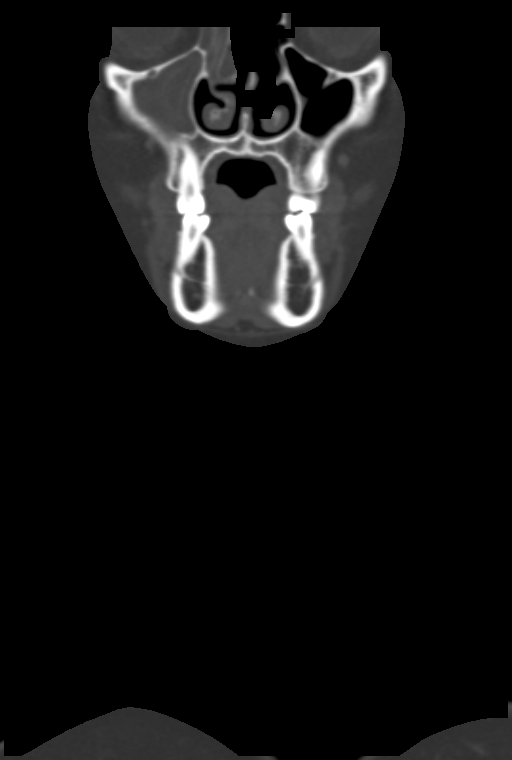
[im 47/116  bone]
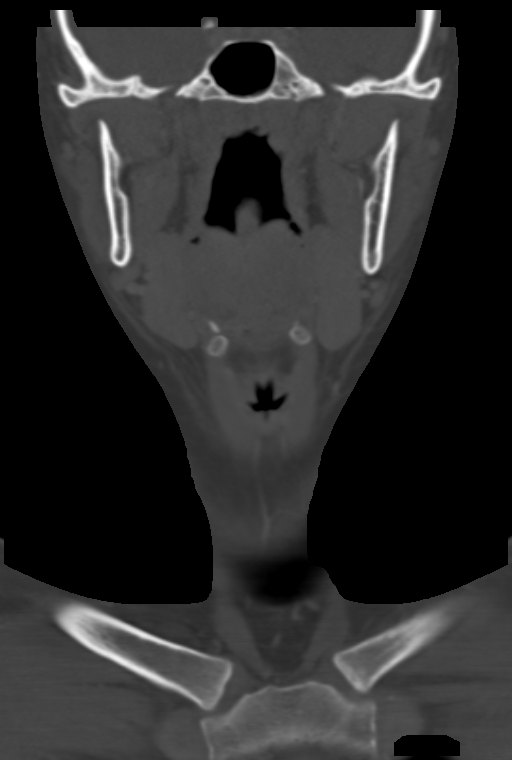
[im 70/116  bone]
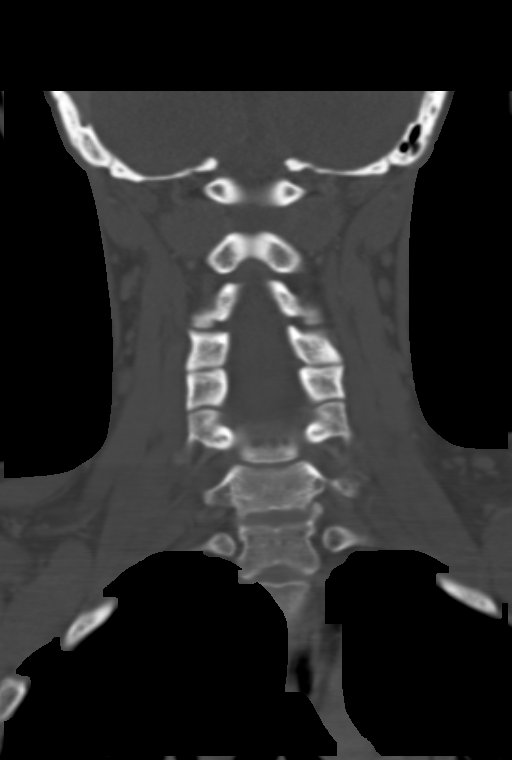

[13 of 33 positions shown; findings below may reference images not displayed]

FINDINGS: Pharynx and larynx: No abnormal enhancement, evidence of abscess or
soft tissue mass.

Salivary glands: Within normal limits.

Thyroid: Within normal limits.

Lymph nodes: Mild shotty jugular chain and submental lymphadenopathy
is within normal limits for age.

Vascular: Patent and unremarkable.

Limited intracranial: Within normal limits.

Visualized orbits: Visualized globes and orbits are intact and
unremarkable.

Mastoids and visualized paranasal sinuses: Diffuse mucoperiosteal
thickening of the right maxillary sinus with high attenuation of the
mucosal margin. There is complete opacification of the central
aspect of the air cell. Opacification extends through the meatus and
into the ethmoid air cells. Otherwise, the remaining visualized
paranasal sinuses and mastoid air cells are normally aerated.

Skeleton: Expected appearance of defects in the alveolar ridge of
the maxilla and mandible bilaterally at the location of the third
molars. On the right, the defect extends into the floor of the
maxillary sinus. No evidence of subperiosteal abscess or abnormal
changes in the osseous structures.

Upper chest: Unremarkable.
IMPRESSION: 1. Complete opacification of the right maxillary sinus with
extensive mucoperiosteal thickening. The sinus disease extends
through the meatus and into the adjacent ethmoid air cells. The
imaging appearance is most consistent with chronic sinusitis.
2. Expected appearance status post bilateral upper and lower wisdom
tooth extraction. No evidence of abscess or other complicating
feature.

## 2017-02-17 ENCOUNTER — Telehealth: Payer: Self-pay | Admitting: Obstetrics and Gynecology

## 2017-02-17 NOTE — Telephone Encounter (Signed)
  We are pleased to inform Ms. Felicia Daugherty that the results of the recent screening test for Cystic fibrosis (CF) and Spinal Muscular Atrophy (SMA) are now available.    CF is a genetic condition that occurs most often in Caucasian persons.  It primarily affects the lungs, digestive, and reproductive systems.  For someone to be at risk for having CF, both of their parents must be carriers for CF.  The testing can detect many persons who are carriers for CF and therefore determine if the pregnancy is at an increased risk for this condition.    The blood test results were negative when examined for the 32 most common mutations (or changes) in the gene for CF.  This means that she does not carry any of the most common changes in this gene.  Testing for these 23 mutations detects approximately 90% of carriers who are Caucasian.  Therefore, the chance that she is a carrier based on this negative result has been reduced from 1 in 25 to approximately 1 in 240.  Because this testing cannot detect all changes that may cause CF, we cannot eliminate the chance that this individual is a carrier completely.  The results of the SMA carrier screening are also available.  SMA is also a recessive genetic condition with variable age of onset and severity caused by mutations in the SMN1 gene.  This carrier testing assesses the number of copies of this gene.  Persons with one copy of the SMN1 gene are carriers, and those with no copies are affected with the condition.  Individuals with two or more copies have a reduced chance to be a carrier.  Not all mutations can be detected with this testing, though it can detect 94.8% of carriers in the Caucasian population.  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  Again, this testing cannot eliminate the chance to have a child with SMA, but dramatically reduces the chance.    We encouraged the patient  to call with any questions or concerns as they arise.  We may be reached at [phone].  Cherly Andersoneborah F. Cathyann Kilfoyle, MS, CGC

## 2017-02-23 LAB — SMN1 COPY NUMBER ANALYSIS (SMA CARRIER SCREENING)

## 2017-04-03 ENCOUNTER — Emergency Department
Admission: EM | Admit: 2017-04-03 | Discharge: 2017-04-03 | Disposition: A | Discharge status: Home / Self Care | Payer: Medicaid Other | Attending: Student in an Organized Health Care Education/Training Program | Admitting: Student in an Organized Health Care Education/Training Program

## 2017-04-03 ENCOUNTER — Emergency Department: Payer: Medicaid Other

## 2017-04-03 DIAGNOSIS — Z87891 Personal history of nicotine dependence: Secondary | ICD-10-CM | POA: Diagnosis not present

## 2017-04-03 DIAGNOSIS — Z3A19 19 weeks gestation of pregnancy: Secondary | ICD-10-CM | POA: Diagnosis not present

## 2017-04-03 DIAGNOSIS — N930 Postcoital and contact bleeding: Secondary | ICD-10-CM | POA: Diagnosis not present

## 2017-04-03 DIAGNOSIS — O34592 Maternal care for other abnormalities of gravid uterus, second trimester: Secondary | ICD-10-CM | POA: Insufficient documentation

## 2017-04-03 DIAGNOSIS — R102 Pelvic and perineal pain: Secondary | ICD-10-CM | POA: Insufficient documentation

## 2017-04-03 DIAGNOSIS — O209 Hemorrhage in early pregnancy, unspecified: Secondary | ICD-10-CM | POA: Diagnosis present

## 2017-04-03 LAB — CBC
HCT: 33.4 % — ABNORMAL LOW (ref 35.0–47.0)
Hemoglobin: 11.5 g/dL — ABNORMAL LOW (ref 12.0–16.0)
MCH: 30.7 pg (ref 26.0–34.0)
MCHC: 34.4 g/dL (ref 32.0–36.0)
MCV: 89.4 fL (ref 80.0–100.0)
Platelets: 313 10*3/uL (ref 150–440)
RBC: 3.74 MIL/uL — ABNORMAL LOW (ref 3.80–5.20)
RDW: 13.9 % (ref 11.5–14.5)
WBC: 12.8 10*3/uL — ABNORMAL HIGH (ref 3.6–11.0)

## 2017-04-03 LAB — BASIC METABOLIC PANEL
Anion gap: 7 (ref 5–15)
BUN: 7 mg/dL (ref 6–20)
CO2: 23 mmol/L (ref 22–32)
Calcium: 9 mg/dL (ref 8.9–10.3)
Chloride: 104 mmol/L (ref 101–111)
Creatinine, Ser: 0.35 mg/dL — ABNORMAL LOW (ref 0.44–1.00)
GFR calc Af Amer: >60 mL/min (ref >60)
GFR calc non Af Amer: >60 mL/min (ref >60)
Glucose, Bld: 109 mg/dL — ABNORMAL HIGH (ref 65–99)
Potassium: 3.3 mmol/L — ABNORMAL LOW (ref 3.5–5.1)
Sodium: 134 mmol/L — ABNORMAL LOW (ref 135–145)

## 2017-04-03 LAB — HCG, QUANTITATIVE, PREGNANCY: hCG, Beta Chain, Quant, S: 48171 m[IU]/mL — ABNORMAL HIGH (ref <5)

## 2017-04-03 LAB — ABO/RH: ABO/RH(D): O POS

## 2017-04-03 MED ORDER — ACETAMINOPHEN 500 MG PO TABS
ORAL_TABLET | ORAL | Status: AC
  Administered 2017-04-03: 1000 mg via ORAL
  Filled 2017-04-03: qty 2

## 2017-04-03 MED ORDER — ACETAMINOPHEN 500 MG PO TABS
1000.0000 mg | ORAL_TABLET | Freq: Once | ORAL | Status: AC
  Administered 2017-04-03: 1000 mg via ORAL

## 2017-04-03 NOTE — ED Notes (Signed)
FIRST NURSE NOTE:  Called L&D, patient is 19 weeks and 5 days and will need to be seen in ED first

## 2017-04-03 NOTE — ED Provider Notes (Signed)
[facility] Emergency Department Provider Note    First MD Initiated Contact with Patient 04/03/17 1715     (approximate)  I have reviewed the triage vital signs and the nursing notes.   HISTORY  Chief Complaint Pelvic Pain and Vaginal Bleeding    HPI ARRIYAH MADEJ is a 21 y.o. female G2P1 roughly 19 weeks and 5 days pregnant presents with an episode of postcoital vaginal bleeding. No pain. Episode occurred roughly 1 hour prior to arrival. His first time that it is happened. Bleeding was noted during intercourse. No significant pain.  No fevers. No family history of bleeding disorders. Has had routine prenatal care. Denies any other complaints at this time.   Past Medical History:  Diagnosis Date  . Anemia   . Anxiety   . Depression    Family History  Problem Relation Age of Onset  . Hypertension Father   . Cancer Maternal Grandmother     Breast  . Hypothyroidism Maternal Grandmother   . Lupus Maternal Grandmother   . Hypothyroidism Mother   . Hypothyroidism Brother   . Diabetes Maternal Grandfather   . Lupus Maternal Aunt   . Diabetes Maternal Uncle    Past Surgical History:  Procedure Laterality Date  . TONSILLECTOMY AND ADENOIDECTOMY     at age 13   Patient Active Problem List   Diagnosis Date Noted  . First trimester screening   . Cervicalgia 06/02/2016  . Bilateral temporomandibular joint pain 06/02/2016  . Irritation of oral cavity 10/18/2015  . Mood disorder (HCC) 10/15/2015  . 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 Anxiety 09/25/2015  . Clinical depression 09/25/2015  . Ileitis 09/25/2015  . Panic attack 09/25/2015  . Allergic rhinitis 05/28/2015  . Dysmenorrhea 05/13/2010      Prior to Admission medications   Medication Sig Start Date End Date Taking? Authorizing Provider  fluticasone (FLONASE) 50 MCG/ACT nasal spray Place 2 sprays into both nostrils daily. Patient not taking: Reported on 02/07/2017 10/15/15   Lorie Phenix, MD    montelukast (SINGULAIR) 10 MG tablet Take 1 tablet (10 mg total) by mouth at bedtime. Patient not taking: Reported on 09/10/2016 03/30/16   Margaretann Loveless, PA-C  Omega-3 Fatty Acids (OMEGA 3 PO) Take by mouth daily. Reported on 06/01/2016    Historical Provider, MD  phentermine 37.5 MG capsule Take 37.5 mg by mouth every morning. Reported on 03/08/2016    Historical Provider, MD  Prenatal Vit-Fe Fumarate-FA (MULTIVITAMIN-PRENATAL) 27-0.8 MG TABS tablet Take 1 tablet by mouth daily at 12 noon.    Historical Provider, MD    Allergies Augmentin [amoxicillin-pot clavulanate]    Social History Social History  Substance Use Topics  . Smoking status: Former Smoker    Packs/day: 0.25    Years: 3.00    Quit date: 10/01/2015  . Smokeless tobacco: Never Used  . Alcohol use No    Review of Systems Patient denies headaches, rhinorrhea, blurry vision, numbness, shortness of breath, chest pain, edema, cough, abdominal pain, nausea, vomiting, diarrhea, dysuria, fevers, rashes or hallucinations unless otherwise stated above in HPI. ____________________________________________   PHYSICAL EXAM:  VITAL SIGNS: Vitals:   04/03/17 1446  BP: 127/72  Pulse: 94  Resp: 18  Temp: 98.2 F (36.8 C)    Constitutional: Alert and oriented. Well appearing and in no acute distress. Eyes: Conjunctivae are normal. PERRL. EOMI. Head: Atraumatic. Nose: No congestion/rhinnorhea. Mouth/Throat: Mucous membranes are moist.  Oropharynx non-erythematous. Neck: No stridor. Painless ROM. No cervical spine tenderness  to palpation Hematological/Lymphatic/Immunilogical: No cervical lymphadenopathy. Cardiovascular: Normal rate, regular rhythm. Grossly normal heart sounds.  Good peripheral circulation. Respiratory: Normal respiratory effort.  No retractions. Lungs CTAB. Gastrointestinal: gravid Soft and nontender. No distention. No abdominal bruits. No CVA tenderness. Genitourinary: normal appearing cervix and  vaginal mucosa.  No cervical iritiation.   Musculoskeletal: No lower extremity tenderness nor edema.  No joint effusions. Neurologic:  Normal speech and language. No gross focal neurologic deficits are appreciated. No gait instability. Skin:  Skin is warm, dry and intact. No rash noted. Psychiatric: Mood and affect are normal. Speech and behavior are normal.  ____________________________________________   LABS (all labs ordered are listed, but only abnormal results are displayed)  Results for orders placed or performed during the hospital encounter of 04/03/17 (from the past 24 hour(s))  ABO/Rh     Status: None   Collection Time: 04/03/17  2:54 PM  Result Value Ref Range   ABO/RH(D) O POS   hCG, quantitative, pregnancy     Status: Abnormal   Collection Time: 04/03/17  2:54 PM  Result Value Ref Range   hCG, Beta Chain, Quant, S 48,171 (H) <5 mIU/mL  CBC     Status: Abnormal   Collection Time: 04/03/17  2:54 PM  Result Value Ref Range   WBC 12.8 (H) 3.6 - 11.0 K/uL   RBC 3.74 (L) 3.80 - 5.20 MIL/uL   Hemoglobin 11.5 (L) 12.0 - 16.0 g/dL   HCT 09.8 (L) 11.9 - 14.7 %   MCV 89.4 80.0 - 100.0 fL   MCH 30.7 26.0 - 34.0 pg   MCHC 34.4 32.0 - 36.0 g/dL   RDW 82.9 56.2 - 13.0 %   Platelets 313 150 - 440 K/uL  Basic metabolic panel     Status: Abnormal   Collection Time: 04/03/17  2:54 PM  Result Value Ref Range   Sodium 134 (L) 135 - 145 mmol/L   Potassium 3.3 (L) 3.5 - 5.1 mmol/L   Chloride 104 101 - 111 mmol/L   CO2 23 22 - 32 mmol/L   Glucose, Bld 109 (H) 65 - 99 mg/dL   BUN 7 6 - 20 mg/dL   Creatinine, Ser 8.65 (L) 0.44 - 1.00 mg/dL   Calcium 9.0 8.9 - 78.4 mg/dL   GFR calc non Af Amer >60 >60 mL/min   GFR calc Af Amer >60 >60 mL/min   Anion gap 7 5 - 15   ____________________________________________  ____________________________________________  RADIOLOGY  I personally reviewed all radiographic images ordered to evaluate for the above acute complaints and reviewed  radiology reports and findings.  These findings were personally discussed with the patient.  Please see medical record for radiology report.  ____________________________________________   PROCEDURES  Procedure(s) performed:  Procedures    Critical Care performed: no ____________________________________________   INITIAL IMPRESSION / ASSESSMENT AND PLAN / ED COURSE  Pertinent labs & imaging results that were available during my care of the patient were reviewed by me and considered in my medical decision making (see chart for details).  DDX: abruption, laceration, previa, hematoma, miscarriage    VELICIA DEJAGER is a 21 y.o. who presents to the ED with complaint of postcoital vaginal bleeding as described above.  Patient is AFVSS in ED. Exam as above. Given current presentation have considered the above differential. Ultrasound ordered to evaluate for that pregnancy is reassuring. Patient in no acute distress. Pelvic exam as above. No persistent bleeding. No evidence of infection. Spoke with Dr. Elesa Massed regarding the  patient's presentation and has recommended pelvic rest and follow-up in clinic this week.  Patient was able to tolerate PO and was able to ambulate with a steady gait.  Have discussed with the patient and available family all diagnostics and treatments performed thus far and all questions were answered to the best of my ability. The patient demonstrates understanding and agreement with plan.       ____________________________________________   FINAL CLINICAL IMPRESSION(S) / ED DIAGNOSES  Final diagnoses:  PCB (post coital bleeding)  [redacted] weeks gestation of pregnancy      NEW MEDICATIONS STARTED DURING THIS VISIT:  New Prescriptions   No medications on file     Note:  This document was prepared using Dragon voice recognition software and may include unintentional dictation errors.    Willy Eddy, MD 04/03/17 [phone]

## 2017-04-03 NOTE — Discharge Instructions (Signed)
No sexual activity until seen by your OBGYN.  Return to ER should you have any additional bleeding or bleeding unrelated to pelvic activity.

## 2017-04-03 NOTE — ED Triage Notes (Signed)
Pt c/o pelvic pain x2 weeks and large amount of vaginal bleeding with bright red blood PTA

## 2017-05-11 ENCOUNTER — Other Ambulatory Visit: Payer: Self-pay | Admitting: Obstetrics and Gynecology

## 2017-05-11 DIAGNOSIS — Z3689 Encounter for other specified antenatal screening: Secondary | ICD-10-CM

## 2017-05-30 ENCOUNTER — Ambulatory Visit
Admission: RE | Admit: 2017-05-30 | Discharge: 2017-05-30 | Disposition: A | Discharge status: Home / Self Care | Payer: Medicaid Other | Source: Ambulatory Visit | Attending: Maternal and Fetal Medicine | Admitting: Maternal and Fetal Medicine

## 2017-05-30 ENCOUNTER — Ambulatory Visit: Payer: Medicaid Other

## 2017-05-30 DIAGNOSIS — Z3A27 27 weeks gestation of pregnancy: Secondary | ICD-10-CM | POA: Insufficient documentation

## 2017-05-30 DIAGNOSIS — Z3689 Encounter for other specified antenatal screening: Secondary | ICD-10-CM | POA: Diagnosis present

## 2017-07-02 ENCOUNTER — Observation Stay
Admission: EM | Admit: 2017-07-02 | Discharge: 2017-07-02 | Disposition: A | Discharge status: Home / Self Care | Payer: Medicaid Other | Attending: Obstetrics and Gynecology | Admitting: Obstetrics and Gynecology

## 2017-07-02 DIAGNOSIS — M549 Dorsalgia, unspecified: Secondary | ICD-10-CM | POA: Insufficient documentation

## 2017-07-02 DIAGNOSIS — O42913 Preterm premature rupture of membranes, unspecified as to length of time between rupture and onset of labor, third trimester: Secondary | ICD-10-CM | POA: Diagnosis present

## 2017-07-02 DIAGNOSIS — O26893 Other specified pregnancy related conditions, third trimester: Principal | ICD-10-CM | POA: Insufficient documentation

## 2017-07-02 DIAGNOSIS — Z3A33 33 weeks gestation of pregnancy: Secondary | ICD-10-CM | POA: Diagnosis not present

## 2017-07-02 DIAGNOSIS — Z3490 Encounter for supervision of normal pregnancy, unspecified, unspecified trimester: Secondary | ICD-10-CM

## 2017-07-02 DIAGNOSIS — O479 False labor, unspecified: Secondary | ICD-10-CM | POA: Diagnosis present

## 2017-07-02 MED ORDER — TERBUTALINE SULFATE 1 MG/ML IJ SOLN
0.2500 mg | Freq: Once | INTRAMUSCULAR | Status: AC
  Administered 2017-07-02: 0.25 mg via SUBCUTANEOUS

## 2017-07-02 MED ORDER — TERBUTALINE SULFATE 1 MG/ML IJ SOLN
INTRAMUSCULAR | Status: AC
  Administered 2017-07-02: 0.25 mg via SUBCUTANEOUS
  Filled 2017-07-02: qty 1

## 2017-07-02 NOTE — Discharge Summary (Signed)
  33 week c/o of LOF for the past 6 hrs. Some back pain.  O:vss rn eval with negative nitrazine  Uterine irritability  Pt received sq terb and po hydrate  No additional ctxcx closed  Reactive NST   D/c home

## 2017-07-02 NOTE — Discharge Instructions (Signed)
Discharge instructions given, all questions answered.

## 2017-08-25 ENCOUNTER — Other Ambulatory Visit: Payer: Self-pay | Admitting: Obstetrics and Gynecology

## 2017-08-28 ENCOUNTER — Inpatient Hospital Stay: Payer: Medicaid Other | Admitting: Anesthesiology

## 2017-08-28 ENCOUNTER — Inpatient Hospital Stay
Admission: EM | Admit: 2017-08-28 | Discharge: 2017-08-29 | DRG: 775 | Disposition: A | Discharge status: Home / Self Care | Payer: Medicaid Other | Attending: Obstetrics and Gynecology | Admitting: Obstetrics and Gynecology

## 2017-08-28 DIAGNOSIS — O9902 Anemia complicating childbirth: Secondary | ICD-10-CM | POA: Diagnosis present

## 2017-08-28 DIAGNOSIS — O480 Post-term pregnancy: Principal | ICD-10-CM | POA: Diagnosis present

## 2017-08-28 DIAGNOSIS — Z3A40 40 weeks gestation of pregnancy: Secondary | ICD-10-CM

## 2017-08-28 DIAGNOSIS — Z87891 Personal history of nicotine dependence: Secondary | ICD-10-CM

## 2017-08-28 DIAGNOSIS — D649 Anemia, unspecified: Secondary | ICD-10-CM | POA: Diagnosis present

## 2017-08-28 DIAGNOSIS — Z3490 Encounter for supervision of normal pregnancy, unspecified, unspecified trimester: Secondary | ICD-10-CM | POA: Diagnosis present

## 2017-08-28 LAB — CBC
HCT: 32.1 % — ABNORMAL LOW (ref 35.0–47.0)
HCT: 32.7 % — ABNORMAL LOW (ref 35.0–47.0)
Hemoglobin: 11 g/dL — ABNORMAL LOW (ref 12.0–16.0)
Hemoglobin: 11.1 g/dL — ABNORMAL LOW (ref 12.0–16.0)
MCH: 28.4 pg (ref 26.0–34.0)
MCH: 28.2 pg (ref 26.0–34.0)
MCHC: 34.1 g/dL (ref 32.0–36.0)
MCHC: 33.9 g/dL (ref 32.0–36.0)
MCV: 83.3 fL (ref 80.0–100.0)
MCV: 83.1 fL (ref 80.0–100.0)
Platelets: 319 10*3/uL (ref 150–440)
Platelets: 319 10*3/uL (ref 150–440)
RBC: 3.86 MIL/uL (ref 3.80–5.20)
RBC: 3.94 MIL/uL (ref 3.80–5.20)
RDW: 14.2 % (ref 11.5–14.5)
RDW: 14.5 % (ref 11.5–14.5)
WBC: 19.2 10*3/uL — ABNORMAL HIGH (ref 3.6–11.0)
WBC: 22.2 10*3/uL — ABNORMAL HIGH (ref 3.6–11.0)

## 2017-08-28 LAB — TYPE AND SCREEN
ABO/RH(D): O POS
Antibody Screen: NEGATIVE

## 2017-08-28 MED ORDER — WITCH HAZEL-GLYCERIN EX PADS: 1.0000 "application " | MEDICATED_PAD | CUTANEOUS | Status: DC | PRN

## 2017-08-28 MED ORDER — BUTORPHANOL TARTRATE 1 MG/ML IJ SOLN: 1.0000 mg | INTRAMUSCULAR | Status: DC | PRN

## 2017-08-28 MED ORDER — OXYTOCIN BOLUS FROM INFUSION
500.0000 mL | Freq: Once | INTRAVENOUS | Status: AC
  Administered 2017-08-28: 500 mL via INTRAVENOUS

## 2017-08-28 MED ORDER — OXYTOCIN 40 UNITS IN LACTATED RINGERS INFUSION - SIMPLE MED
INTRAVENOUS | Status: AC
  Filled 2017-08-28: qty 1000

## 2017-08-28 MED ORDER — MAGNESIUM HYDROXIDE 400 MG/5ML PO SUSP
30.0000 mL | ORAL | Status: DC | PRN
  Filled 2017-08-28: qty 30

## 2017-08-28 MED ORDER — LIDOCAINE HCL (PF) 1 % IJ SOLN
30.0000 mL | INTRAMUSCULAR | Status: DC | PRN
  Administered 2017-08-28: 30 mL via SUBCUTANEOUS

## 2017-08-28 MED ORDER — BENZOCAINE-MENTHOL 20-0.5 % EX AERO: 1.0000 "application " | INHALATION_SPRAY | CUTANEOUS | Status: DC | PRN

## 2017-08-28 MED ORDER — AMMONIA AROMATIC IN INHA
RESPIRATORY_TRACT | Status: AC
  Filled 2017-08-28: qty 10

## 2017-08-28 MED ORDER — MEASLES, MUMPS & RUBELLA VAC ~~LOC~~ INJ
0.5000 mL | INJECTION | Freq: Once | SUBCUTANEOUS | Status: DC
  Filled 2017-08-28: qty 0.5

## 2017-08-28 MED ORDER — FENTANYL 2.5 MCG/ML W/ROPIVACAINE 0.15% IN NS 100 ML EPIDURAL (ARMC)
EPIDURAL | Status: AC
  Filled 2017-08-28: qty 100

## 2017-08-28 MED ORDER — FENTANYL 2.5 MCG/ML W/ROPIVACAINE 0.15% IN NS 100 ML EPIDURAL (ARMC)
12.0000 mL/h | EPIDURAL | Status: DC
  Administered 2017-08-28: 12 mL/h via EPIDURAL

## 2017-08-28 MED ORDER — EPHEDRINE 5 MG/ML INJ
10.0000 mg | INTRAVENOUS | Status: DC | PRN
  Filled 2017-08-28: qty 2

## 2017-08-28 MED ORDER — MISOPROSTOL 25 MCG QUARTER TABLET: 25.0000 ug | ORAL_TABLET | ORAL | Status: DC | PRN

## 2017-08-28 MED ORDER — LIDOCAINE HCL (PF) 1 % IJ SOLN
INTRAMUSCULAR | Status: AC
  Filled 2017-08-28: qty 30

## 2017-08-28 MED ORDER — TERBUTALINE SULFATE 1 MG/ML IJ SOLN: 0.2500 mg | Freq: Once | INTRAMUSCULAR | Status: DC | PRN

## 2017-08-28 MED ORDER — IBUPROFEN 600 MG PO TABS
600.0000 mg | ORAL_TABLET | Freq: Four times a day (QID) | ORAL | Status: DC
  Administered 2017-08-28 – 2017-08-29 (×5): 600 mg via ORAL
  Filled 2017-08-28 (×5): qty 1

## 2017-08-28 MED ORDER — LACTATED RINGERS IV SOLN: 500.0000 mL | INTRAVENOUS | Status: DC | PRN

## 2017-08-28 MED ORDER — SIMETHICONE 80 MG PO CHEW: 80.0000 mg | CHEWABLE_TABLET | ORAL | Status: DC | PRN

## 2017-08-28 MED ORDER — LACTATED RINGERS IV SOLN: 500.0000 mL | Freq: Once | INTRAVENOUS | Status: DC

## 2017-08-28 MED ORDER — FERROUS SULFATE 325 (65 FE) MG PO TABS
325.0000 mg | ORAL_TABLET | Freq: Two times a day (BID) | ORAL | Status: DC
  Administered 2017-08-28 – 2017-08-29 (×2): 325 mg via ORAL
  Filled 2017-08-28 (×2): qty 1

## 2017-08-28 MED ORDER — OXYTOCIN 40 UNITS IN LACTATED RINGERS INFUSION - SIMPLE MED: 2.5000 [IU]/h | INTRAVENOUS | Status: DC

## 2017-08-28 MED ORDER — PRENATAL MULTIVITAMIN CH
1.0000 | ORAL_TABLET | Freq: Every day | ORAL | Status: DC
  Administered 2017-08-28 – 2017-08-29 (×2): 1 via ORAL
  Filled 2017-08-28 (×2): qty 1

## 2017-08-28 MED ORDER — CEFAZOLIN SODIUM-DEXTROSE 2-4 GM/100ML-% IV SOLN
2.0000 g | Freq: Once | INTRAVENOUS | Status: DC
  Filled 2017-08-28 (×2): qty 100

## 2017-08-28 MED ORDER — ACETAMINOPHEN 325 MG PO TABS
650.0000 mg | ORAL_TABLET | ORAL | Status: DC | PRN
  Filled 2017-08-28 (×4): qty 2

## 2017-08-28 MED ORDER — LACTATED RINGERS IV SOLN
INTRAVENOUS | Status: DC
  Administered 2017-08-28: 03:00:00 via INTRAVENOUS

## 2017-08-28 MED ORDER — LIDOCAINE-EPINEPHRINE (PF) 1.5 %-1:200000 IJ SOLN
INTRAMUSCULAR | Status: DC | PRN
  Administered 2017-08-28: 3 mL via EPIDURAL

## 2017-08-28 MED ORDER — LIDOCAINE HCL (PF) 1 % IJ SOLN
INTRAMUSCULAR | Status: DC | PRN
  Administered 2017-08-28: 2 mL via SUBCUTANEOUS

## 2017-08-28 MED ORDER — LACTATED RINGERS IV SOLN: INTRAVENOUS | Status: DC

## 2017-08-28 MED ORDER — SENNOSIDES-DOCUSATE SODIUM 8.6-50 MG PO TABS
2.0000 | ORAL_TABLET | ORAL | Status: DC
  Administered 2017-08-28: 2 via ORAL
  Filled 2017-08-28: qty 2

## 2017-08-28 MED ORDER — ACETAMINOPHEN 325 MG PO TABS
650.0000 mg | ORAL_TABLET | ORAL | Status: DC | PRN
  Administered 2017-08-28 – 2017-08-29 (×4): 650 mg via ORAL

## 2017-08-28 MED ORDER — SODIUM CHLORIDE 0.9 % IV SOLN
INTRAVENOUS | Status: DC | PRN
  Administered 2017-08-28 (×2): 5 mL via EPIDURAL

## 2017-08-28 MED ORDER — BUTORPHANOL TARTRATE 2 MG/ML IJ SOLN
1.0000 mg | INTRAMUSCULAR | Status: DC | PRN
  Administered 2017-08-28: 2 mg via INTRAVENOUS
  Filled 2017-08-28: qty 1

## 2017-08-28 MED ORDER — ONDANSETRON HCL 4 MG/2ML IJ SOLN: 4.0000 mg | Freq: Four times a day (QID) | INTRAMUSCULAR | Status: DC | PRN

## 2017-08-28 MED ORDER — PHENYLEPHRINE 40 MCG/ML (10ML) SYRINGE FOR IV PUSH (FOR BLOOD PRESSURE SUPPORT)
80.0000 ug | PREFILLED_SYRINGE | INTRAVENOUS | Status: DC | PRN
  Filled 2017-08-28: qty 5

## 2017-08-28 MED ORDER — COCONUT OIL OIL
1.0000 "application " | TOPICAL_OIL | Status: DC | PRN
  Administered 2017-08-29: 1 via TOPICAL
  Filled 2017-08-28: qty 120

## 2017-08-28 MED ORDER — OXYTOCIN 10 UNIT/ML IJ SOLN
INTRAMUSCULAR | Status: AC
  Filled 2017-08-28: qty 2

## 2017-08-28 MED ORDER — SOD CITRATE-CITRIC ACID 500-334 MG/5ML PO SOLN: 30.0000 mL | ORAL | Status: DC | PRN

## 2017-08-28 MED ORDER — DIPHENHYDRAMINE HCL 25 MG PO CAPS: 25.0000 mg | ORAL_CAPSULE | Freq: Four times a day (QID) | ORAL | Status: DC | PRN

## 2017-08-28 MED ORDER — ONDANSETRON HCL 4 MG PO TABS: 4.0000 mg | ORAL_TABLET | ORAL | Status: DC | PRN

## 2017-08-28 MED ORDER — ZOLPIDEM TARTRATE 5 MG PO TABS: 5.0000 mg | ORAL_TABLET | Freq: Every evening | ORAL | Status: DC | PRN

## 2017-08-28 MED ORDER — ONDANSETRON HCL 4 MG/2ML IJ SOLN: 4.0000 mg | INTRAMUSCULAR | Status: DC | PRN

## 2017-08-28 MED ORDER — CEFAZOLIN SODIUM-DEXTROSE 1-4 GM/50ML-% IV SOLN
INTRAVENOUS | Status: AC
  Administered 2017-08-28: 2 g
  Filled 2017-08-28: qty 100

## 2017-08-28 MED ORDER — MISOPROSTOL 200 MCG PO TABS
ORAL_TABLET | ORAL | Status: AC
  Filled 2017-08-28: qty 4

## 2017-08-28 MED ORDER — DIBUCAINE 1 % RE OINT: 1.0000 "application " | TOPICAL_OINTMENT | RECTAL | Status: DC | PRN

## 2017-08-28 MED ORDER — DIPHENHYDRAMINE HCL 50 MG/ML IJ SOLN: 12.5000 mg | INTRAMUSCULAR | Status: DC | PRN

## 2017-08-28 NOTE — Discharge Summary (Signed)
Obstetric Discharge Summary   Patient ID: Patient Name: Felicia Daugherty DOB: June 11, 1996 MRN: [id number]  Date of Admission: 08/28/2017 Date of Discharge: 08/28/2017  Primary OB: [facility]  Gestational Age at Delivery: [redacted]w[redacted]d   Antepartum complications: fetal pelviectasis with resolvement Admitting Diagnosis:active labor  Secondary Diagnoses: Patient Active Problem List   Diagnosis Date Noted  . Post-dates pregnancy 08/28/2017  . Encounter for induction of labor 08/28/2017  . Pregnancy 07/02/2017  . Uterine contractions during pregnancy 07/02/2017  . First trimester screening   . Cervicalgia 06/02/2016  . Bilateral temporomandibular joint pain 06/02/2016  . Irritation of oral cavity 10/18/2015  . Mood disorder (HCC) 10/15/2015  . 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 Anxiety 09/25/2015  . Clinical depression 09/25/2015  . Ileitis 09/25/2015  . Panic attack 09/25/2015  . Allergic rhinitis 05/28/2015  . Dysmenorrhea 05/13/2010    Augmentation:None Complications: None Intrapartum complications/course:  Admitted active labor and progressed  Without complication . CLE placed . SVD with loose nuchal cord and true knot noted . Date of Delivery: 08/28/17 Delivered By: Maisie Fus SchermerhornMD Delivery Type: spontaneous vaginal delivery Anesthesia: epidural Placenta: sponatneous Laceration:  Episiotomy: none  Newborn Data: Live born female  Birth Weight:   APGAR: 8, 9      Postpartum Course  Patient had an uncomplicated postpartum course.  By time of discharge on PPD#1, her pain was controlled on oral pain medications; she had appropriate lochia and was ambulating, voiding without difficulty and tolerating regular diet.  She was deemed stable for discharge to home.     Labs: CBC Latest Ref Rng & Units 08/28/2017 04/03/2017 09/26/2015  WBC 3.6 - 11.0 K/uL 19.2(H) 12.8(H) 6.8  Hemoglobin 12.0 - 16.0 g/dL 11.0(L) 11.5(L) 13.0  Hematocrit 35.0 - 47.0 % 32.1(L) 33.4(L) 39.8   Platelets 150 - 440 K/uL 319 313 291   O POS  Physical exam:  BP 126/74   Pulse 80   Temp 98.2 F (36.8 C) (Oral)   Resp 16   Ht  (1.626 m)   Wt 95.3 kg (210 lb)   LMP 11/16/2016   BMI 36.05 kg/m  General: alert and no distress Pulm: normal respiratory effort Lochia: appropriate Abdomen: soft, NT Uterine Fundus: firm, below umbilicus Incision: c/d/i, healing well, no significant drainage, no dehiscence, no significant erythema:1st degree lac Extremities: No evidence of DVT seen on physical exam. No lower extremity edema.   Disposition: stable, discharge to home Baby Feeding: breastmilk Baby Disposition: home with mom  Contraception: will decide at 6 weeks pp  Prenatal Labs:  O+ Rubella Imm , VAR Imm  GBS neg    Plan:  Felicia Daugherty was discharged to home in good condition. Follow-up appointment at [facility]  in 6 weeks weeks  Discharge Instructions: Per After Visit Summary. Activity: Advance as tolerated. Pelvic rest for 6 weeks.  Refer to After Visit Summary Diet: Regular Discharge Medications:PNV, FE, Ibuprofen  Outpatient follow up: 6 weeks pp   Signed: Sharee Pimple, RN, MSN, CNM, FNP

## 2017-08-28 NOTE — H&P (Signed)
Felicia Daugherty is a 21 y.o. female presenting for induction for post dates and obesity . Marland KitchenEDC 08/23/17 based on LMP  OB complication fetal pelviectasis resolved 06/06/17 OB History    Gravida Para Term Preterm AB Living   SAB TAB Ectopic Multiple Live Births           1     Past Medical History:  Diagnosis Date  . Anemia   . Anxiety   . Depression    Past Surgical History:  Procedure Laterality Date  . TONSILLECTOMY AND ADENOIDECTOMY     at age 48   Family History: family history includes Cancer in her maternal grandmother; Diabetes in her maternal grandfather and maternal uncle; Hypertension in her father; Hypothyroidism in her brother, maternal grandmother, and mother; Lupus in her maternal aunt and maternal grandmother. Social History:  reports that she quit smoking about 7 months ago. She has a 0.75 pack-year smoking history. She has never used smokeless tobacco. She reports that she does not drink alcohol or use drugs.     Maternal Diabetes: No Genetic Screening: Normal Maternal Ultrasounds/Referrals: Normal Fetal Ultrasounds or other Referrals:  Other: fetal pelviectasis , normal repeat  Maternal Substance Abuse:  No Significant Maternal Medications:  None Significant Maternal Lab Results:  None Other Comments:  None  ROS History Dilation: 8 Effacement (%): 90 Station: 0 Exam by:: M. trogdon RN Blood pressure 126/74, pulse 80, temperature 98.2 F (36.8 C), temperature source Oral, resp. rate 16, height  (1.626 m), weight 95.3 kg (210 lb), last menstrual period 11/16/2016. Exam Physical Exam  Lungs cta  cv rrr  gravid  Reassuring fetal monitoring  Prenatal labs: ABO, Rh: --/--/O POS (09/16 0328) Antibody: NEG (09/16 0328) Rubella:   RPR:    HBsAg:    HIV:    GBS:     Assessment/Plan: Induction     Felicia Daugherty 08/28/2017, 7:14 AM

## 2017-08-28 NOTE — Anesthesia Procedure Notes (Signed)
Epidural Patient location during procedure: OB Start time: 08/28/2017 5:53 AM End time: 08/28/2017 6:01 AM  Staffing Anesthesiologist: Lenard Simmer Performed: anesthesiologist   Preanesthetic Checklist Completed: patient identified, site marked, surgical consent, pre-op evaluation, timeout performed, IV checked, risks and benefits discussed and monitors and equipment checked  Epidural Patient position: sitting Prep: ChloraPrep Patient monitoring: heart rate, continuous pulse ox and blood pressure Approach: midline Location: L3-L4 Injection technique: LOR saline  Needle:  Needle type: Tuohy  Needle gauge: 17 G Needle length: 9 cm and 9 Needle insertion depth: 5.5 cm Catheter type: closed end flexible Catheter size: 19 Gauge Catheter at skin depth: 10 cm Test dose: negative and 1.5% lidocaine with Epi 1:200 K  Assessment Sensory level: T10 Events: blood not aspirated, injection not painful, no injection resistance, negative IV test and no paresthesia  Additional Notes Pt. Evaluated and documentation done after procedure finished. Patient identified. Risks/Benefits/Options discussed with patient including but not limited to bleeding, infection, nerve damage, paralysis, failed block, incomplete pain control, headache, blood pressure changes, nausea, vomiting, reactions to medication both or allergic, itching and postpartum back pain. Confirmed with bedside nurse the patient's most recent platelet count. Confirmed with patient that they are not currently taking any anticoagulation, have any bleeding history or any family history of bleeding disorders. Patient expressed understanding and wished to proceed. All questions were answered. Sterile technique was used throughout the entire procedure. Please see nursing notes for vital signs. Test dose was given through epidural catheter and negative prior to continuing to dose epidural or start infusion. Warning signs of high block given to  the patient including shortness of breath, tingling/numbness in hands, complete motor block, or any concerning symptoms with instructions to call for help. Patient was given instructions on fall risk and not to get out of bed. All questions and concerns addressed with instructions to call with any issues or inadequate analgesia.   Patient tolerated the insertion well without immediate complications.Reason for block:procedure for pain

## 2017-08-28 NOTE — Anesthesia Postprocedure Evaluation (Signed)
Anesthesia Post Note  Patient: Felicia Daugherty  Procedure(s) Performed: * No procedures listed *  Patient location during evaluation: Mother Baby Anesthesia Type: Epidural Level of consciousness: awake and alert Pain management: pain level controlled Vital Signs Assessment: post-procedure vital signs reviewed and stable Respiratory status: spontaneous breathing, nonlabored ventilation and respiratory function stable Cardiovascular status: stable Postop Assessment: no headache, no backache and patient able to bend at knees Anesthetic complications: no     Last Vitals:  Vitals:   08/28/17 0632 08/28/17 0647  BP: 127/74 126/74  Pulse: 91 80  Resp:    Temp:      Last Pain:  Vitals:   08/28/17 0731  TempSrc:   PainSc: 0-No pain                 Cleda Mccreedy Piscitello

## 2017-08-28 NOTE — OB Triage Note (Signed)
Pt arrived to triage with c/o ctxns beginning around 1900, states roughly every 4-17min apart.  Pt denies vaginal bleeding and LOF.  States she feels baby move normally. EFM and toco applied.

## 2017-08-28 NOTE — Anesthesia Preprocedure Evaluation (Signed)
Anesthesia Evaluation  Patient identified by MRN, date of birth, ID band Patient awake    Reviewed: Allergy & Precautions, H&P , NPO status , Patient's Chart, lab work & pertinent test results, reviewed documented beta blocker date and time   History of Anesthesia Complications Negative for: history of anesthetic complications  Airway Mallampati: II  TM Distance: >3 FB Neck ROM: full    Dental  (+) Teeth Intact   Pulmonary neg shortness of breath, neg COPD, Recent URI , former smoker,           Cardiovascular Exercise Tolerance: Good negative cardio ROS       Neuro/Psych PSYCHIATRIC DISORDERS (Depression) negative neurological ROS     GI/Hepatic Neg liver ROS, GERD  ,  Endo/Other  negative endocrine ROS  Renal/GU negative Renal ROS  negative genitourinary   Musculoskeletal   Abdominal   Peds  Hematology  (+) anemia ,   Anesthesia Other Findings Past Medical History: No date: Anemia No date: Anxiety No date: Depression   Reproductive/Obstetrics (+) Pregnancy                             Anesthesia Physical Anesthesia Plan  ASA: II  Anesthesia Plan: Epidural   Post-op Pain Management:    Induction:   PONV Risk Score and Plan:   Airway Management Planned:   Additional Equipment:   Intra-op Plan:   Post-operative Plan:   Informed Consent: I have reviewed the patients History and Physical, chart, labs and discussed the procedure including the risks, benefits and alternatives for the proposed anesthesia with the patient or authorized representative who has indicated his/her understanding and acceptance.   Dental Advisory Given  Plan Discussed with: Anesthesiologist, CRNA and Surgeon  Anesthesia Plan Comments:         Anesthesia Quick Evaluation

## 2017-08-29 LAB — CBC
HCT: 31 % — ABNORMAL LOW (ref 35.0–47.0)
Hemoglobin: 10.6 g/dL — ABNORMAL LOW (ref 12.0–16.0)
MCH: 28.7 pg (ref 26.0–34.0)
MCHC: 34.1 g/dL (ref 32.0–36.0)
MCV: 84.1 fL (ref 80.0–100.0)
Platelets: 282 10*3/uL (ref 150–440)
RBC: 3.69 MIL/uL — ABNORMAL LOW (ref 3.80–5.20)
RDW: 14.7 % — ABNORMAL HIGH (ref 11.5–14.5)
WBC: 15.6 10*3/uL — ABNORMAL HIGH (ref 3.6–11.0)

## 2017-08-29 NOTE — Progress Notes (Signed)
Post Partum Day 1 Subjective: Wants to go home today if baby passes testing  Objective: Blood pressure 118/65, pulse 74, temperature 98 F (36.7 C), temperature source Oral, resp. rate 20, height  (1.626 m), weight 95.3 kg (210 lb), last menstrual period 11/16/2016, SpO2 98 %.  Physical Exam:  General: A,A&O x3 Lochia: mod, no clots Uterine Fundus: FF and U-1 Incision:N/A DVT Evaluation: Neg Homans   Recent Labs  08/28/17 0810 08/29/17 0551  HGB 11.1* 10.6*  HCT 32.7* 31.0*    Assessment/Plan: A:1. PPD#1 P: DC if baby released today   LOS: 1 day   Sharee Pimple 08/29/2017, 9:56 AM

## 2017-08-29 NOTE — Progress Notes (Signed)
Pt discharged with infant.  Discharge instructions and follow up appointment given to and reviewed with pt.  Escorted out by staff. 

## 2017-08-30 LAB — RPR: RPR Ser Ql: NONREACTIVE

## 2017-08-30 LAB — SURGICAL PATHOLOGY

## 2017-09-22 IMAGING — US US OB COMP LESS 14 WK
1 series · 14 of 28 positions shown · non-contrast
Comparison: None.

CLINICAL DATA: Positive urine pregnancy test

EXAM:
TRANSVAGINAL OB ULTRASOUND; OBSTETRIC <14 WK ULTRASOUND
TECHNIQUE: Transvaginal ultrasound was performed for complete evaluation of the
gestation as well as the maternal uterus, adnexal regions, and
pelvic cul-de-sac.

[Series 1: us ob comp less 14 wk · 0.13mm/px · 14 of 102 slices shown]
[im 4/102]
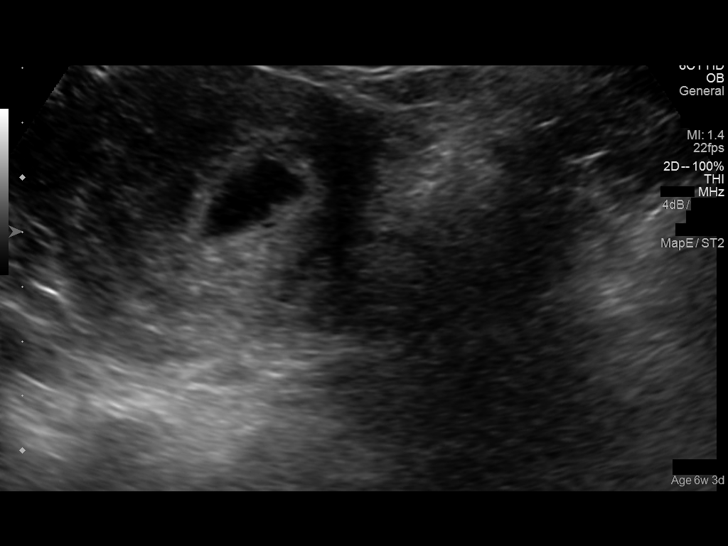
[im 12/102]
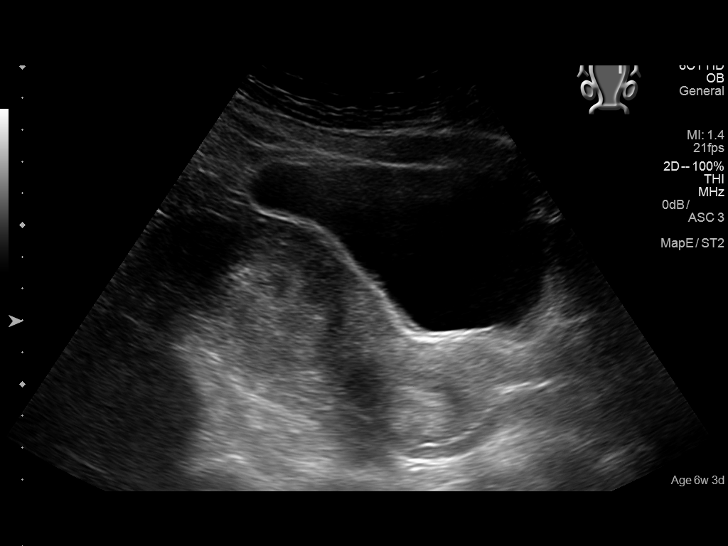
[im 19/102]
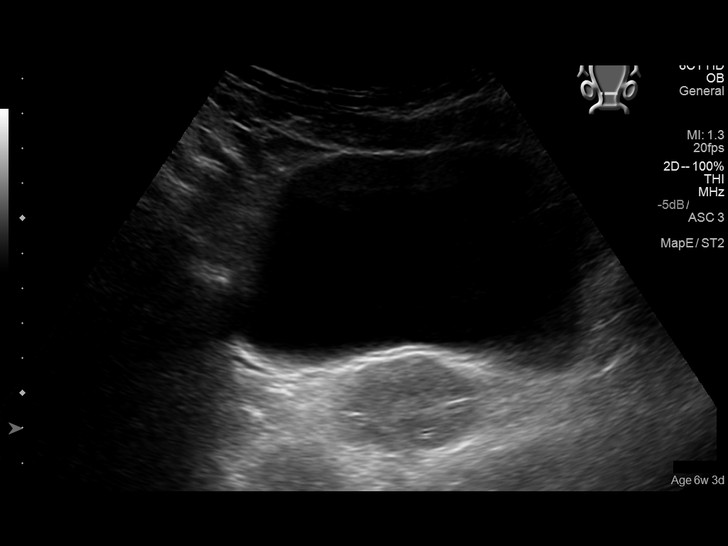
[im 27/102]
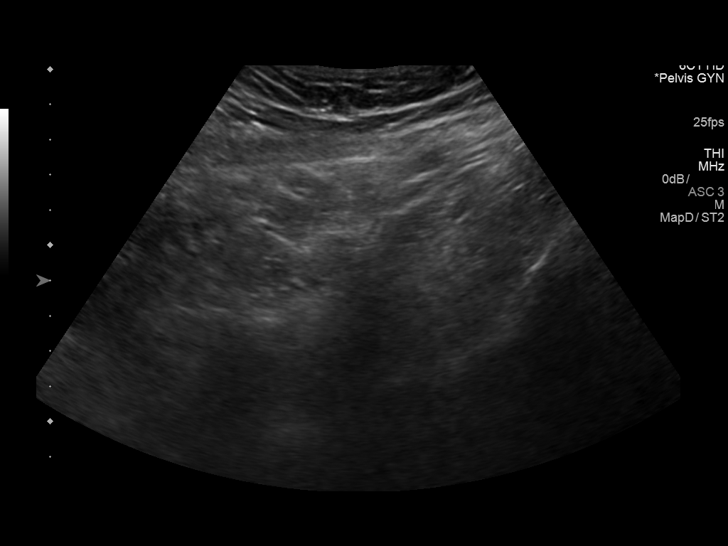
[im 34/102]
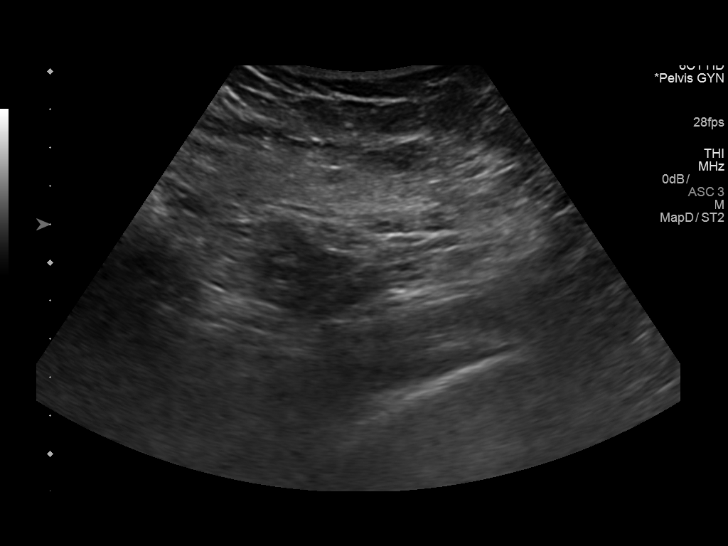
[im 42/102]
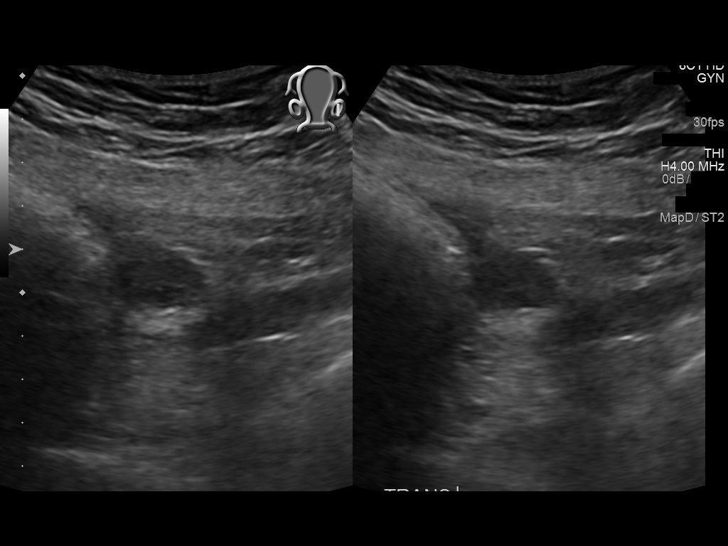
[im 49/102]
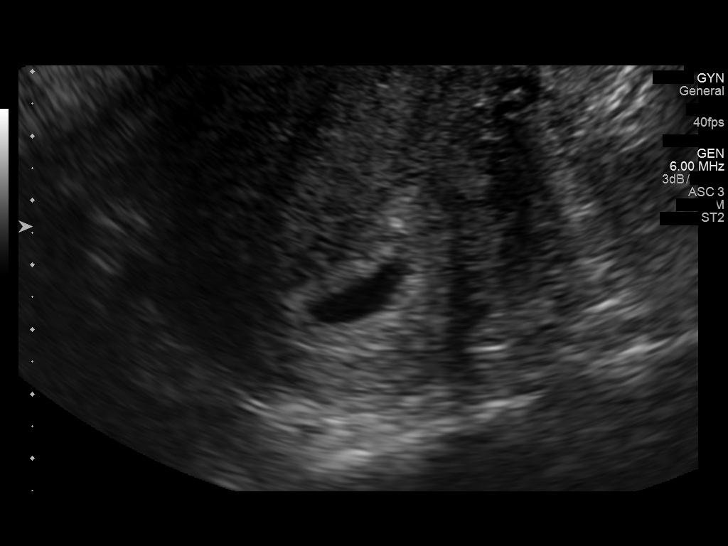
[im 57/102]
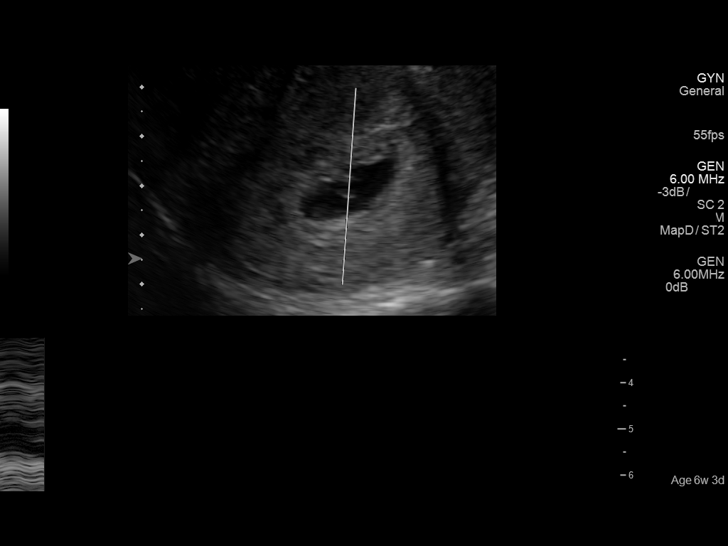
[im 64/102]
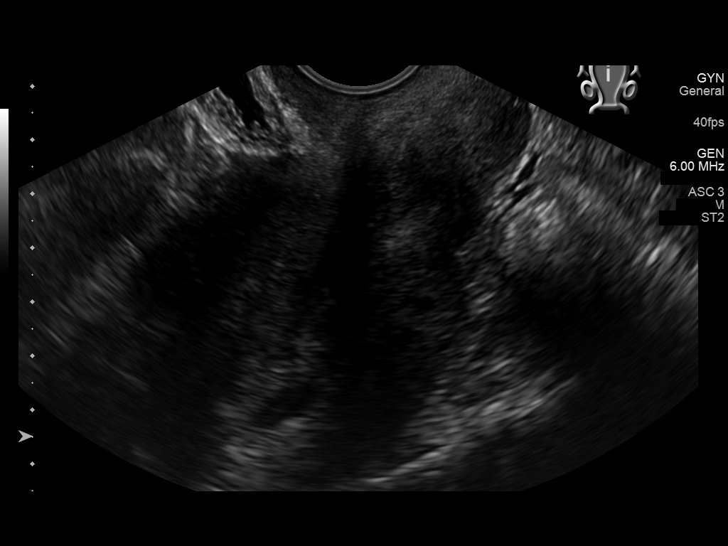
[im 72/102]
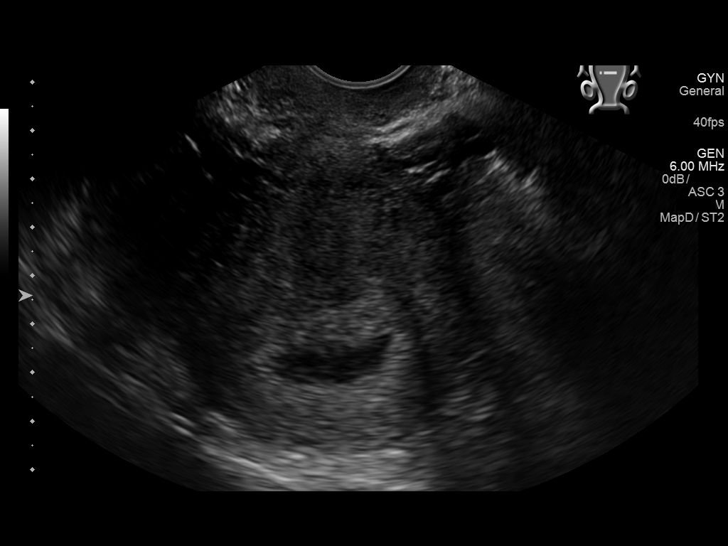
[im 79/102]
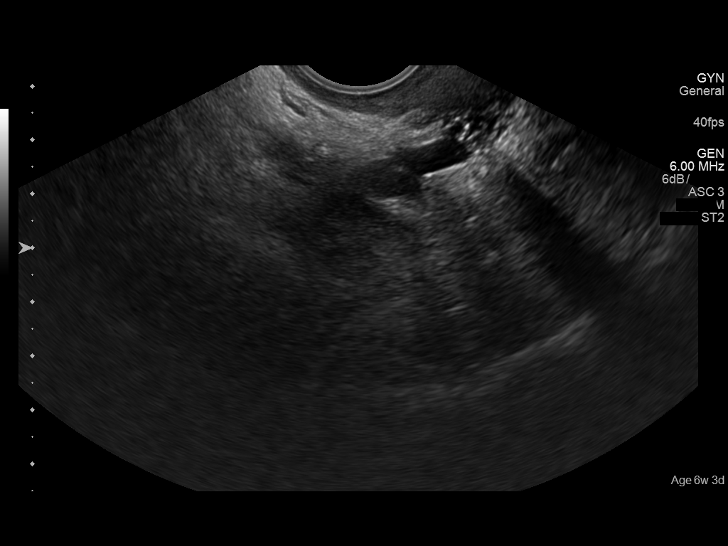
[im 87/102]
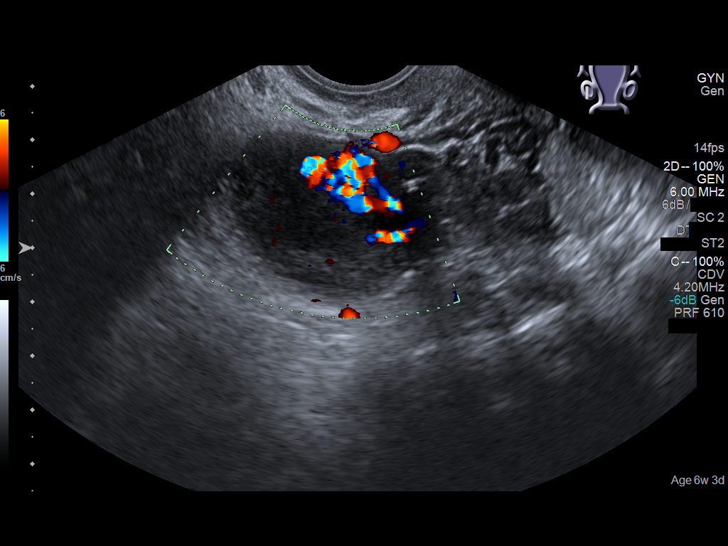
[im 94/102]
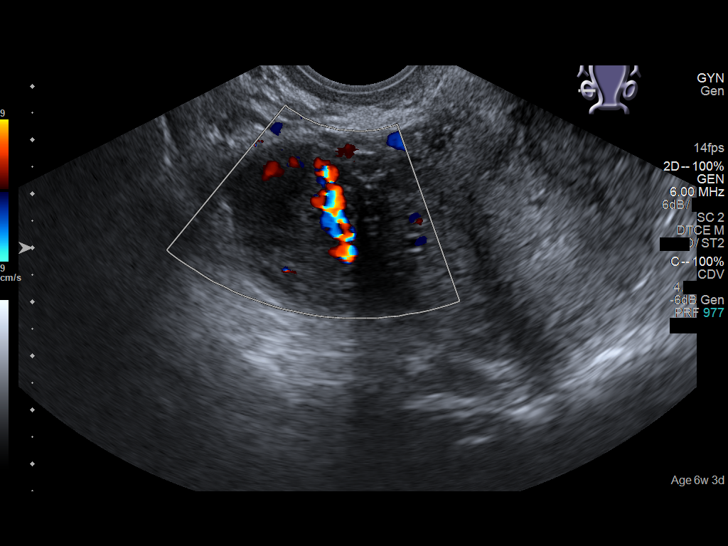
[im 102/102]
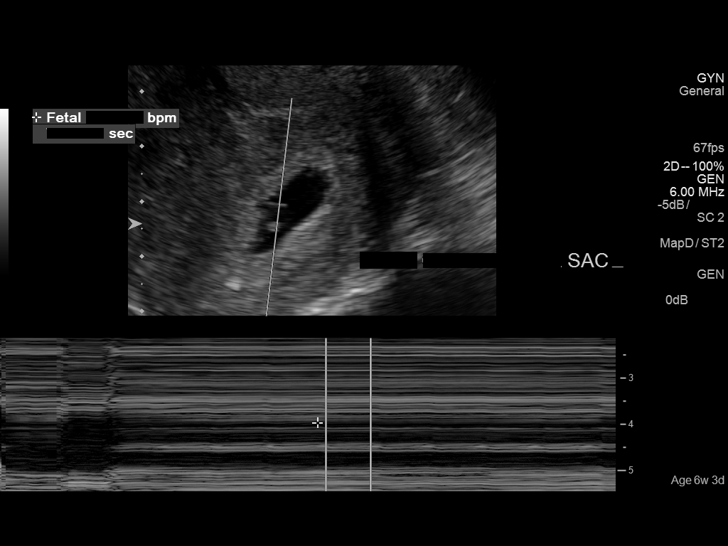

[14 of 28 positions shown; findings below may reference images not displayed]

FINDINGS: Intrauterine gestational sac: Single

Yolk sac:  Present

Embryo:  Present

Cardiac Activity: Present

Heart Rate: 114 bpm

MSD:   mm    w     d

CRL:   4  mm   6 w 1 d                  US EDC: 08/25/2017

Subchorionic hemorrhage:  None visualized.

Maternal uterus/adnexae: Both maternal ovaries appear normal and
there is no mass or significant free fluid seen within either
adnexal region. Trace free fluid in the pelvis is likely physiologic
in nature.
IMPRESSION: Single live intrauterine pregnancy with estimated gestational age of
6 weeks and 1 day.

## 2019-03-13 ENCOUNTER — Emergency Department
Admission: EM | Admit: 2019-03-13 | Discharge: 2019-03-13 | Disposition: A | Discharge status: Home / Self Care | Payer: Medicaid Other | Attending: Emergency Medicine | Admitting: Emergency Medicine

## 2019-03-13 ENCOUNTER — Emergency Department: Payer: Medicaid Other

## 2019-03-13 ENCOUNTER — Encounter: Payer: Self-pay | Admitting: *Deleted

## 2019-03-13 ENCOUNTER — Other Ambulatory Visit: Payer: Self-pay

## 2019-03-13 DIAGNOSIS — Z79899 Other long term (current) drug therapy: Secondary | ICD-10-CM | POA: Diagnosis not present

## 2019-03-13 DIAGNOSIS — F410 Panic disorder [episodic paroxysmal anxiety] without agoraphobia: Secondary | ICD-10-CM | POA: Diagnosis not present

## 2019-03-13 DIAGNOSIS — Z87891 Personal history of nicotine dependence: Secondary | ICD-10-CM | POA: Diagnosis not present

## 2019-03-13 DIAGNOSIS — F1410 Cocaine abuse, uncomplicated: Secondary | ICD-10-CM | POA: Insufficient documentation

## 2019-03-13 DIAGNOSIS — R0602 Shortness of breath: Secondary | ICD-10-CM | POA: Diagnosis not present

## 2019-03-13 LAB — COMPREHENSIVE METABOLIC PANEL
ALT: 20 U/L (ref 0–44)
AST: 24 U/L (ref 15–41)
Albumin: 4.4 g/dL (ref 3.5–5.0)
Alkaline Phosphatase: 68 U/L (ref 38–126)
Anion gap: 15 (ref 5–15)
BUN: 12 mg/dL (ref 6–20)
CO2: 20 mmol/L — ABNORMAL LOW (ref 22–32)
Calcium: 9.1 mg/dL (ref 8.9–10.3)
Chloride: 102 mmol/L (ref 98–111)
Creatinine, Ser: 0.53 mg/dL (ref 0.44–1.00)
GFR calc Af Amer: >60 mL/min (ref >60)
GFR calc non Af Amer: >60 mL/min (ref >60)
Glucose, Bld: 108 mg/dL — ABNORMAL HIGH (ref 70–99)
Potassium: 3.2 mmol/L — ABNORMAL LOW (ref 3.5–5.1)
Sodium: 137 mmol/L (ref 135–145)
Total Bilirubin: 0.8 mg/dL (ref 0.3–1.2)
Total Protein: 8.5 g/dL — ABNORMAL HIGH (ref 6.5–8.1)

## 2019-03-13 LAB — CBC WITH DIFFERENTIAL/PLATELET
Abs Immature Granulocytes: 0.06 10*3/uL (ref 0.00–0.07)
Basophils Absolute: 0.1 10*3/uL (ref 0.0–0.1)
Basophils Relative: 1 %
Eosinophils Absolute: 0.2 10*3/uL (ref 0.0–0.5)
Eosinophils Relative: 2 %
HCT: 43.2 % (ref 36.0–46.0)
Hemoglobin: 14.7 g/dL (ref 12.0–15.0)
Immature Granulocytes: 1 %
Lymphocytes Relative: 23 %
Lymphs Abs: 3 10*3/uL (ref 0.7–4.0)
MCH: 30.3 pg (ref 26.0–34.0)
MCHC: 34 g/dL (ref 30.0–36.0)
MCV: 89.1 fL (ref 80.0–100.0)
Monocytes Absolute: 0.8 10*3/uL (ref 0.1–1.0)
Monocytes Relative: 6 %
Neutro Abs: 9 10*3/uL — ABNORMAL HIGH (ref 1.7–7.7)
Neutrophils Relative %: 67 %
Platelets: 385 10*3/uL (ref 150–400)
RBC: 4.85 MIL/uL (ref 3.87–5.11)
RDW: 13.4 % (ref 11.5–15.5)
WBC: 13.2 10*3/uL — ABNORMAL HIGH (ref 4.0–10.5)
nRBC: 0 % (ref 0.0–0.2)

## 2019-03-13 LAB — TROPONIN I: Troponin I: <0.03 ng/mL (ref <0.03)

## 2019-03-13 LAB — URINE DRUG SCREEN, QUALITATIVE (ARMC ONLY)
Amphetamines, Ur Screen: POSITIVE — AB
Barbiturates, Ur Screen: NOT DETECTED
Benzodiazepine, Ur Scrn: NOT DETECTED
Cannabinoid 50 Ng, Ur ~~LOC~~: POSITIVE — AB
Cocaine Metabolite,Ur ~~LOC~~: POSITIVE — AB
MDMA (Ecstasy)Ur Screen: NOT DETECTED
Methadone Scn, Ur: NOT DETECTED
Opiate, Ur Screen: NOT DETECTED
Phencyclidine (PCP) Ur S: NOT DETECTED
Tricyclic, Ur Screen: NOT DETECTED

## 2019-03-13 LAB — ETHANOL: Alcohol, Ethyl (B): 19 mg/dL — ABNORMAL HIGH (ref <10)

## 2019-03-13 MED ORDER — LORAZEPAM 2 MG/ML IJ SOLN: 1.0000 mg | Freq: Once | INTRAMUSCULAR | Status: DC

## 2019-03-13 MED ORDER — SODIUM CHLORIDE 0.9 % IV SOLN
Freq: Once | INTRAVENOUS | Status: AC
  Administered 2019-03-13: 14:00:00 via INTRAVENOUS

## 2019-03-13 MED ORDER — LORAZEPAM 2 MG/ML IJ SOLN
2.0000 mg | Freq: Once | INTRAMUSCULAR | Status: AC
  Administered 2019-03-13: 2 mg via INTRAVENOUS
  Filled 2019-03-13: qty 1

## 2019-03-13 MED ORDER — DIAZEPAM 5 MG PO TABS
10.0000 mg | ORAL_TABLET | Freq: Once | ORAL | Status: AC
  Administered 2019-03-13: 10 mg via ORAL
  Filled 2019-03-13: qty 2

## 2019-03-13 MED ORDER — LORAZEPAM 1 MG PO TABS: 1.0000 mg | ORAL_TABLET | Freq: Two times a day (BID) | ORAL | 0 refills | Status: DC

## 2019-03-13 NOTE — ED Provider Notes (Signed)
[facility] Emergency Department Provider Note       Time seen: ----------------------------------------- 12:55 PM on 03/13/2019 -----------------------------------------   I have reviewed the triage vital signs and the nursing notes.  HISTORY   Chief Complaint Shortness of Breath    HPI Felicia Daugherty is a 23 y.o. female with a history of anemia, anxiety, depression who presents to the ED for shortness of breath since last night.  Patient reports she used some cocaine and thinks it may have been laced with something else.  She complains of significant shortness of breath but denies any chest pain, denies any recent respiratory symptoms.  Her main concern right now is her fianc who was also using cocaine with her last night.  Past Medical History:  Diagnosis Date  . Anemia   . Anxiety   . Depression     Patient Active Problem List   Diagnosis Date Noted  . Post-dates pregnancy 08/28/2017  . Encounter for induction of labor 08/28/2017  . Pregnancy 07/02/2017  . Uterine contractions during pregnancy 07/02/2017  . First trimester screening   . Cervicalgia 06/02/2016  . Bilateral temporomandibular joint pain 06/02/2016  . Irritation of oral cavity 10/18/2015  . Mood disorder (HCC) 10/15/2015  . 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 Anxiety 09/25/2015  . Clinical depression 09/25/2015  . Ileitis 09/25/2015  . Panic attack 09/25/2015  . Allergic rhinitis 05/28/2015  . Dysmenorrhea 05/13/2010    Past Surgical History:  Procedure Laterality Date  . TONSILLECTOMY AND ADENOIDECTOMY     at age 68    Allergies Augmentin [amoxicillin-pot clavulanate]  Social History Social History   Tobacco Use  . Smoking status: Former Smoker    Packs/day: 0.25    Years: 3.00    Pack years: 0.75    Last attempt to quit: 01/13/2017    Years since quitting: 2.1  . Smokeless tobacco: Never Used  Substance Use Topics  . Alcohol use: No  . Drug use: No   Review of  Systems Constitutional: Negative for fever. Cardiovascular: Negative for chest pain. Respiratory: Positive for shortness of breath Gastrointestinal: Negative for abdominal pain, vomiting and diarrhea. Musculoskeletal: Negative for back pain. Skin: Negative for rash. Neurological: Negative for headaches, focal weakness or numbness.  All systems negative/normal/unremarkable except as stated in the HPI  ____________________________________________   PHYSICAL EXAM:  VITAL SIGNS: ED Triage Vitals  Enc Vitals Group     BP 03/13/19 1224 (!) 141/97     Pulse Rate 03/13/19 1224 (!) 161     Resp --      Temp 03/13/19 1224 98.1 F (36.7 C)     Temp Source 03/13/19 1224 Oral     SpO2 03/13/19 1224 100 %     Weight 03/13/19 1219 180 lb (81.6 kg)     Height 03/13/19 1219 5\' 3"  (1.6 m)     Head Circumference --      Peak Flow --      Pain Score 03/13/19 1218 0     Pain Loc --      Pain Edu? --      Excl. in GC? --    Constitutional: Alert and oriented.  Anxious, mild distress Eyes: Conjunctivae are normal. Normal extraocular movements. ENT      Head: Normocephalic and atraumatic.      Nose: No congestion/rhinnorhea.      Mouth/Throat: Mucous membranes are moist.      Neck: No stridor. Cardiovascular: Rapid rate, regular rhythm. No murmurs, rubs,  or gallops. Respiratory: Normal respiratory effort without tachypnea nor retractions. Breath sounds are clear and equal bilaterally. No wheezes/rales/rhonchi. Gastrointestinal: Soft and nontender. Normal bowel sounds Musculoskeletal: Nontender with normal range of motion in extremities. No lower extremity tenderness nor edema. Neurologic:  Normal speech and language. No gross focal neurologic deficits are appreciated.  Skin:  Skin is warm, dry and intact. No rash noted. Psychiatric: Mood and affect are normal. Speech and behavior are normal.  ____________________________________________  EKG: Interpreted by me.  Sinus tachycardia with a  rate of 158 bpm, nonspecific ST and T wave abnormalities, long QT  ____________________________________________  ED COURSE:  As part of my medical decision making, I reviewed the following data within the electronic MEDICAL RECORD NUMBER History obtained from family if available, nursing notes, old chart and ekg, as well as notes from prior ED visits. Patient presented for recent cocaine use for shortness of breath, we will assess with labs and imaging as indicated at this time.   Procedures TYMEKA PRIVETTE was evaluated in Emergency Department on 03/13/2019 for the symptoms described in the history of present illness. She was evaluated in the context of the global COVID-19 pandemic, which necessitated consideration that the patient might be at risk for infection with the SARS-CoV-2 virus that causes COVID-19. Institutional protocols and algorithms that pertain to the evaluation of patients at risk for COVID-19 are in a state of rapid change based on information released by regulatory bodies including the CDC and federal and state organizations. These policies and algorithms were followed during the patient's care in the ED.  ____________________________________________   LABS (pertinent positives/negatives)  Labs Reviewed  CBC WITH DIFFERENTIAL/PLATELET - Abnormal; Notable for the following components:      Result Value   WBC 13.2 (*)    Neutro Abs 9.0 (*)    All other components within normal limits  COMPREHENSIVE METABOLIC PANEL - Abnormal; Notable for the following components:   Potassium 3.2 (*)    CO2 20 (*)    Glucose, Bld 108 (*)    Total Protein 8.5 (*)    All other components within normal limits  URINE DRUG SCREEN, QUALITATIVE ([facility] ONLY) - Abnormal; Notable for the following components:   Amphetamines, Ur Screen POSITIVE (*)    Cocaine Metabolite,Ur [place] POSITIVE (*)    Cannabinoid 50 Ng, Ur [place] POSITIVE (*)    All other components within normal limits  ETHANOL - Abnormal; Notable  for the following components:   Alcohol, Ethyl (B) 19 (*)    All other components within normal limits  TROPONIN I    RADIOLOGY Images were viewed by me  Chest x-ray IMPRESSION: No active cardiopulmonary disease. ____________________________________________   DIFFERENTIAL DIAGNOSIS   Substance abuse, anxiety, MI, arrhythmia  FINAL ASSESSMENT AND PLAN  Substance abuse   Plan: The patient had presented for recent cocaine use with shortness of breath and anxiety about her fianc. Patient's labs revealed mild leukocytosis but also positive for amphetamines, cocaine and marijuana. Patient's imaging reveal any acute process.  She was given benzodiazepines here and has calmed down although she still is tachycardic and very worried about her fianc.  She was also given IV fluids.  She is cleared for outpatient follow-up.   Ulice Dash, MD    Note: This note was generated in part or whole with voice recognition software. Voice recognition is usually quite accurate but there are transcription errors that can and very often do occur. I apologize for any typographical errors that  were not detected and corrected.     Emily Filbert, MD 03/13/19 [phone]

## 2019-03-13 NOTE — ED Notes (Signed)
Pt provided phone per request to call mother.

## 2019-03-13 NOTE — ED Triage Notes (Signed)
Here with another pat with same.  Short of breath since last night.  Had used some cocaine, and she thinks it may have been laced with something.

## 2019-07-26 ENCOUNTER — Encounter: Payer: Self-pay | Admitting: Physician Assistant

## 2019-07-26 ENCOUNTER — Other Ambulatory Visit (HOSPITAL_COMMUNITY)
Admission: RE | Admit: 2019-07-26 | Discharge: 2019-07-26 | Disposition: A | Discharge status: Home / Self Care | Payer: Medicaid Other | Source: Ambulatory Visit | Attending: Physician Assistant | Admitting: Physician Assistant

## 2019-07-26 ENCOUNTER — Ambulatory Visit (INDEPENDENT_AMBULATORY_CARE_PROVIDER_SITE_OTHER): Payer: Medicaid Other | Admitting: Physician Assistant

## 2019-07-26 ENCOUNTER — Other Ambulatory Visit: Payer: Self-pay

## 2019-07-26 VITALS — BP 116/70 | HR 64 | Temp 98.2°F | Resp 16 | Ht 64.0 in | Wt 202.8 lb

## 2019-07-26 DIAGNOSIS — L989 Disorder of the skin and subcutaneous tissue, unspecified: Secondary | ICD-10-CM | POA: Diagnosis not present

## 2019-07-26 DIAGNOSIS — Z124 Encounter for screening for malignant neoplasm of cervix: Secondary | ICD-10-CM | POA: Insufficient documentation

## 2019-07-26 DIAGNOSIS — F39 Unspecified mood [affective] disorder: Secondary | ICD-10-CM

## 2019-07-26 DIAGNOSIS — Z0000 Encounter for general adult medical examination without abnormal findings: Secondary | ICD-10-CM

## 2019-07-26 DIAGNOSIS — R062 Wheezing: Secondary | ICD-10-CM | POA: Diagnosis not present

## 2019-07-26 DIAGNOSIS — F5081 Binge eating disorder: Secondary | ICD-10-CM

## 2019-07-26 DIAGNOSIS — Z3009 Encounter for other general counseling and advice on contraception: Secondary | ICD-10-CM | POA: Diagnosis not present

## 2019-07-26 DIAGNOSIS — F50819 Binge eating disorder, unspecified: Secondary | ICD-10-CM

## 2019-07-26 MED ORDER — HYDROXYZINE HCL 10 MG PO TABS: 10.0000 mg | ORAL_TABLET | Freq: Three times a day (TID) | ORAL | 0 refills | Status: DC | PRN

## 2019-07-26 MED ORDER — ESCITALOPRAM OXALATE 10 MG PO TABS: 10.0000 mg | ORAL_TABLET | Freq: Every day | ORAL | 1 refills | Status: DC

## 2019-07-26 MED ORDER — ALBUTEROL SULFATE HFA 108 (90 BASE) MCG/ACT IN AERS: 2.0000 | INHALATION_SPRAY | Freq: Four times a day (QID) | RESPIRATORY_TRACT | 0 refills | Status: DC | PRN

## 2019-07-26 MED ORDER — ETHYNODIOL DIAC-ETH ESTRADIOL 1-35 MG-MCG PO TABS
1.0000 | ORAL_TABLET | Freq: Every day | ORAL | 3 refills | Status: DC
Start: 2019-07-26 — End: 2020-09-08

## 2019-07-26 NOTE — Progress Notes (Signed)
Patient: Felicia Daugherty, Female    DOB: 03/06/1996, 23 y.o.   MRN: [id number] Visit Date: 07/27/2019  Today's Provider: Trey SailorsAdriana M Lanah Steines, PA-C   Chief Complaint  Patient presents with  . Annual Exam   Subjective:    Annual physical exam Felicia Daugherty is a 23 y.o. female who presents today for health maintenance and complete physical. She feels well. She reports exercising walking 1-2 mil a week. She reports she is sleeping well.  Last Reported Pap- 01/2017 negative   Depression and Anxiety: Reports she has had this issues prior to her first child 6 years ago. Reports she had post partum depression not officially diagnosed but experienced symptoms. Has never been on medication, reported hearing things about medication that scared her off from it. Reports seeing her heart rate was 99 and it sent her into a panic and a crying spell. Reports she is not hanging out with her friends. Has not been inside a store for 3 years and is nervous to be around people. Feels like everyone is staring at her. Does online grocery shopping, orders clothing online. Grandmother with depression and anxiety. Thinks mother may have undiagnosed mental illness. Brother diagnosed with depression. She is drinking heavily on the weekends in order to deal with her anxiety. She also reports that she will binge eat. Has overwhelming urge to eat large quantities of food that she is unable to control. Feels guilty afterwards.  Has a skin lesion she is concerned about on her back.  Would like to start OCP. Denies history of migraines, heart attack or stroke.   She also reports that occasionally she will wheeze.  -----------------------------------------------------------------   Review of Systems  12 point ROS reviewed and is negative except for HPI.   Social History She  reports that she quit smoking about 2 years ago. She has a 0.75 pack-year smoking history. She has never used smokeless tobacco. She reports that  she does not drink alcohol or use drugs. Social History   Socioeconomic History  . Marital status: Single    Spouse name: Not on file  . Number of children: Not on file  . Years of education: Not on file  . Highest education level: Not on file  Occupational History  . Not on file  Social Needs  . Financial resource strain: Not on file  . Food insecurity    Worry: Not on file    Inability: Not on file  . Transportation needs    Medical: Not on file    Non-medical: Not on file  Tobacco Use  . Smoking status: Former Smoker    Packs/day: 0.25    Years: 3.00    Pack years: 0.75    Quit date: 01/13/2017    Years since quitting: 2.5  . Smokeless tobacco: Never Used  Substance and Sexual Activity  . Alcohol use: No  . Drug use: No  . Sexual activity: Yes  Lifestyle  . Physical activity    Days per week: Not on file    Minutes per session: Not on file  . Stress: Not on file  Relationships  . Social Musicianconnections    Talks on phone: Not on file    Gets together: Not on file    Attends religious service: Not on file    Active member of club or organization: Not on file    Attends meetings of clubs or organizations: Not on file    Relationship status: Not  on file  Other Topics Concern  . Not on file  Social History Narrative  . Not on file    Patient Active Problem List   Diagnosis Date Noted  . Post-dates pregnancy 08/28/2017  . Encounter for induction of labor 08/28/2017  . Pregnancy 07/02/2017  . Uterine contractions during pregnancy 07/02/2017  . First trimester screening   . Cervicalgia 06/02/2016  . Bilateral temporomandibular joint pain 06/02/2016  . Irritation of oral cavity 10/18/2015  . Mood disorder ([place]) 10/15/2015  . 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 Anxiety 09/25/2015  . Clinical depression 09/25/2015  . Ileitis 09/25/2015  . Panic attack 09/25/2015  . Allergic rhinitis 05/28/2015  . Dysmenorrhea 05/13/2010    Past Surgical History:  Procedure  Laterality Date  . TONSILLECTOMY AND ADENOIDECTOMY     at age 78    Family History  Family Status  Relation Name Status  . Father  Alive  . MGM  Alive  . Mother  Alive  . Brother  Alive       h/o kidney stones  . MGF  Deceased       died from congestiv heart failure  . PGF  Deceased       died from an MI  . Mat Aunt  (Not Specified)  . Mat Uncle  (Not Specified)   Her family history includes Cancer in her maternal grandmother; Diabetes in her maternal grandfather and maternal uncle; Hypertension in her father; Hypothyroidism in her brother, maternal grandmother, and mother; Lupus in her maternal aunt and maternal grandmother.     Allergies  Allergen Reactions  . Augmentin [Amoxicillin-Pot Clavulanate] Nausea And Vomiting and Palpitations    Previous Medications   PRENATAL VIT-FE FUMARATE-FA (MULTIVITAMIN-PRENATAL) 27-0.8 MG TABS TABLET    Take 1 tablet by mouth daily at 12 noon.    Patient Care Team: Mar Daring, PA-C as PCP - General (Family Medicine)      Objective:   Vitals: BP 116/70   Pulse 64   Temp 98.2 F (36.8 C) (Oral)   Resp 16   Ht 5\' 4"  (1.626 m)   Wt 202 lb 12.8 oz (92 kg)   LMP 06/29/2019   BMI 34.81 kg/m    Physical Exam Exam conducted with a chaperone present.  Constitutional:      Appearance: Normal appearance.  Cardiovascular:     Rate and Rhythm: Normal rate and regular rhythm.     Heart sounds: Normal heart sounds.  Pulmonary:     Effort: Pulmonary effort is normal.     Breath sounds: Normal breath sounds.  Chest:     Breasts:        Right: Normal.        Left: Normal.  Abdominal:     General: Abdomen is flat. Bowel sounds are normal.     Palpations: Abdomen is soft.  Genitourinary:    Exam position: Lithotomy position.     Labia:        Right: No rash.        Left: No rash.      Cervix: Normal.     Uterus: Normal.      Adnexa: Right adnexa normal and left adnexa normal.  Skin:    General: Skin is warm and dry.      Findings: Lesion present.       Neurological:     Mental Status: She is alert and oriented to person, place, and time. Mental status is at baseline.  Psychiatric:  Mood and Affect: Mood normal.        Behavior: Behavior normal.      Depression Screen PHQ 2/9 Scores 07/26/2019  PHQ - 2 Score 5  PHQ- 9 Score 22      Assessment & Plan:     Routine Health Maintenance and Physical Exam  Exercise Activities and Dietary recommendations Goals   None     Immunization History  Administered Date(s) Administered  . Meningococcal Conjugate 06/26/2010    Health Maintenance  Topic Date Due  . HIV Screening  02/16/2009  . PAP-Cervical Cytology Screening  09/24/2018  . PAP SMEAR-Modifier  09/24/2018  . INFLUENZA VACCINE  07/14/2019  . TETANUS/TDAP  06/07/2027     Discussed health benefits of physical activity, and encouraged her to engage in regular exercise appropriate for her age and condition.    1. Annual physical exam   2. Mood disorder (HCC)  Will start Lexapro 10 mg daily and give hydroxyzine PRN. Will refer her to counselor for therapy.   - Ambulatory referral to [facility] - escitalopram (LEXAPRO) 10 MG tablet; Take 1 tablet (10 mg total) by mouth daily.  Dispense: 90 tablet; Refill: 1 - hydrOXYzine (ATARAX/VISTARIL) 10 MG tablet; Take 1 tablet (10 mg total) by mouth 3 (three) times daily as needed.  Dispense: 30 tablet; Refill: 0  3. Cervical cancer screening  - Cytology - PAP  4. Family planning  - ethynodiol-ethinyl estradiol [place](KELNOR 1/35) 1-35 MG-MCG tablet; Take 1 tablet by mouth daily.  Dispense: 3 Package; Refill: 3  5. Binge eating disorder  Will address after mood is better controlled.   6. Wheezing  - albuterol (VENTOLIN HFA) 108 (90 Base) MCG/ACT inhaler; Inhale 2 puffs into the lungs every 6 (six) hours as needed for wheezing or shortness of breath.  Dispense: 6.7 g; Refill: 0  7. Skin lesion  Lesion looks  benign.   The entirety of the information documented in the History of Present Illness, Review of Systems and Physical Exam were personally obtained by me. Portions of this information were initially documented by Sheliah HatchKathleen Wolford, CMA and reviewed by me for thoroughness and accuracy.   F/u 6 weeks for anxiety and depression --------------------------------------------------------------------

## 2019-07-27 LAB — CYTOLOGY - PAP
Chlamydia: NEGATIVE
Diagnosis: NEGATIVE
Neisseria Gonorrhea: NEGATIVE

## 2019-07-27 NOTE — Patient Instructions (Signed)

## 2019-07-31 ENCOUNTER — Ambulatory Visit: Payer: Self-pay | Admitting: *Deleted

## 2019-07-31 ENCOUNTER — Telehealth: Payer: Self-pay

## 2019-07-31 NOTE — Telephone Encounter (Signed)
Patient advised as below.  

## 2019-07-31 NOTE — Chronic Care Management (AMB) (Signed)
    Care Management   Unsuccessful Call Note 07/31/2019 Name: Felicia Daugherty MRN: [id number] DOB: 1996/03/13  Patient  is a 23 year old female who sees Fenton Malling, [place] for primary care. Fenton Malling PA-C asked the CCM team to consult the patient for counseling and mental health resources.  Referral was placed 07/26/19. Patient's last office visit was 07/26/19    This social worker was unable to reach patient via telephone today to consent for services. I have left HIPAA compliant voicemail asking patient to return my call. (unsuccessful outreach #1).   Plan: Will follow-up within 7 business days via telephone.     Elliot Gurney, [place] Worker  [facility] [phone]

## 2019-07-31 NOTE — Telephone Encounter (Signed)
-----   Message from Trinna Post, [place] sent at 07/27/2019  4:47 PM EDT ----- Normal pap. Negative gonorrhea and chlamydia. Repeat 3 years.

## 2019-08-10 ENCOUNTER — Encounter: Payer: Self-pay | Admitting: *Deleted

## 2019-08-10 ENCOUNTER — Ambulatory Visit: Payer: Self-pay | Admitting: *Deleted

## 2019-08-10 NOTE — Progress Notes (Signed)
This encounter was created in error - please disregard.

## 2019-08-10 NOTE — Chronic Care Management (AMB) (Signed)
    Care Management   Unsuccessful Call Note 08/10/2019 Name: Felicia Daugherty MRN: [id number] DOB: September 09, 1996  Patient  is a 23 year old femalewho sees Fenton Malling, [place] for primary care. Fenton Malling PA-Casked the CCM team to consult the patient for counseling and mental health resources.  Referral was placed8/13/20. Patient's last office visit was 07/26/19  This social worker was unable to reach patient via telephone today to consent for services. Ihave left HIPAA compliant voicemail asking patient to return my call. (unsuccessful outreach #2).    Plan: Will follow-up within 7 business days via telephone.      Elliot Gurney, [place] Worker  [facility] [phone]

## 2019-08-21 ENCOUNTER — Ambulatory Visit: Payer: Self-pay | Admitting: *Deleted

## 2019-08-21 NOTE — Chronic Care Management (AMB) (Signed)
   Care Management   Unsuccessful Call Note 08/21/2019 Name: Felicia Daugherty MRN: [id number] DOB: May 14, 1996  Patientis a 23year old femalewho seesJennifer Burnette, PA-Cfor primary care. Fenton Malling PA-Casked the CCM team to consult the patient forcounseling and mental health resources.Referral was placed8/13/20. Patient's last office visit was8/13/20  This social worker was unable to reach patient via telephone todayto consent for services. Ihave left HIPAA compliant voicemail asking patient to return my call. (unsuccessful outreach #3).  Plan: This Education officer, museum will not make any further attempts to reach patient as 3 attempts have been made with no return call. This social worker will be happy to engage patient upon her return call.     Elliot Gurney, [place] Worker  [facility] [phone]

## 2019-09-06 ENCOUNTER — Ambulatory Visit (INDEPENDENT_AMBULATORY_CARE_PROVIDER_SITE_OTHER): Payer: Medicaid Other | Admitting: Physician Assistant

## 2019-09-06 DIAGNOSIS — F1491 Cocaine use, unspecified, in remission: Secondary | ICD-10-CM

## 2019-09-06 DIAGNOSIS — F39 Unspecified mood [affective] disorder: Secondary | ICD-10-CM

## 2019-09-06 DIAGNOSIS — Z87898 Personal history of other specified conditions: Secondary | ICD-10-CM | POA: Diagnosis not present

## 2019-09-06 DIAGNOSIS — E6609 Other obesity due to excess calories: Secondary | ICD-10-CM

## 2019-09-06 DIAGNOSIS — Z6835 Body mass index (BMI) 35.0-35.9, adult: Secondary | ICD-10-CM

## 2019-09-06 MED ORDER — TOPIRAMATE 50 MG PO TABS: ORAL_TABLET | ORAL | 0 refills | Status: DC

## 2019-09-06 NOTE — Patient Instructions (Signed)

## 2019-09-06 NOTE — Progress Notes (Signed)
Patient: Felicia Daugherty Female    DOB: 1996/12/05   23 y.o.   MRN: [id number] Visit Date: 09/06/2019  Today's Provider: Trey Sailors, PA-C   Chief Complaint  Patient presents with  . Follow-up   Subjective:    Virtual Visit via Telephone Note  I connected with Wilmer Floor on 09/06/19 at  1:20 PM EDT by telephone and verified that I am speaking with the correct person using two identifiers.  Location: Patient: Home Provider: Provider   I discussed the limitations, risks, security and privacy concerns of performing an evaluation and management service by telephone and the availability of in person appointments. I also discussed with the patient that there may be a patient responsible charge related to this service. The patient expressed understanding and agreed to proceed.     HPI  Follow up for mood disorder  The patient was last seen for this 6 days ago. Changes made at last visit include start lexapro 10 mg daily.   She reports excellent compliance with treatment. She feels that condition is Improved. She is having side effects. Sleepy Has not gotten into contact with Child psychotherapist. Three unsuccessful attempts and referral has been closed. Patient reports she had her phone set to screen unfamiliar numbers.   BMI: Obese category. Patient reports uncontrollable urge to eat, binge eating. She has a history of phentermine from [facility]. [facility] weight loss clinic. She was on this last March. Visit to ER on 03/13/2019 for cocaine use and SOB. Reports to ER that she was using cocaine along with her boyfriend and thinks it may have been laced with something. Reports today that she was using marijuana and that it may have been laced with cocaine. Drug screen positive for marijuana, cocaine, and amphetamines.  BMI Readings from Last 5 Encounters:  07/26/19 34.81 kg/m  03/13/19 31.89 kg/m  08/28/17 36.05 kg/m  05/30/17 34.47 kg/m  04/03/17 32.61 kg/m      ------------------------------------------------------------------------------------   Allergies  Allergen Reactions  . Augmentin [Amoxicillin-Pot Clavulanate] Nausea And Vomiting and Palpitations     Current Outpatient Medications:  .  albuterol (VENTOLIN HFA) 108 (90 Base) MCG/ACT inhaler, Inhale 2 puffs into the lungs every 6 (six) hours as needed for wheezing or shortness of breath., Disp: 6.7 g, Rfl: 0 .  escitalopram (LEXAPRO) 10 MG tablet, Take 1 tablet (10 mg total) by mouth daily., Disp: 90 tablet, Rfl: 1 .  hydrOXYzine (ATARAX/VISTARIL) 10 MG tablet, Take 1 tablet (10 mg total) by mouth 3 (three) times daily as needed., Disp: 30 tablet, Rfl: 0 .  ethynodiol-ethinyl estradiol (KELNOR 1/35) 1-35 MG-MCG tablet, Take 1 tablet by mouth daily., Disp: 3 Package, Rfl: 3 .  Prenatal Vit-Fe Fumarate-FA (MULTIVITAMIN-PRENATAL) 27-0.8 MG TABS tablet, Take 1 tablet by mouth daily at 12 noon., Disp: , Rfl:   Review of Systems  Social History   Tobacco Use  . Smoking status: Former Smoker    Packs/day: 0.25    Years: 3.00    Pack years: 0.75    Quit date: 01/13/2017    Years since quitting: 2.6  . Smokeless tobacco: Never Used  Substance Use Topics  . Alcohol use: No      Objective:   There were no vitals taken for this visit. There were no vitals filed for this visit.There is no height or weight on file to calculate BMI.   Physical Exam   No results found for any visits on 09/06/19.  Assessment & Plan     1. Mood disorder [facility])  Patient is doing better with anxiety regarding this. She reports some fatigue. She is taking the medication in the morning and I have suggested she may try to take medication at night to help with this. Have provided our social worker's number so that she may reach out.   2. History of cocaine use   3. Class 2 obesity due to excess calories without serious comorbidity with body mass index (BMI) of 35.0 to 35.9 in adult  Patient  interested in weight loss medications. She has previously been on phentermine, which I don't find to be a sustainable solution. Initially had discussed vyvanse last visit but I have reviewed ER visit from 03/13/2019 where UDS was positive for amphetamines, marijuana, and cocaine. She was prescribed phentermine at that time. Reported to ER she used cocaine that she thought was laced with something else. Reports to me that she used weed that she thought was laced with cocaine. In any event, I don't think vyvanse is appropriate in this situation and have recommended Topamax. Counseled on side effects. She is willing to try this. Will ramp up to 100 mg nightly over the course of 4 weeks. Will see her back then virtually. Recommended to record starting weight.   The entirety of the information documented in the History of Present Illness, Review of Systems and Physical Exam were personally obtained by me. Portions of this information were initially documented by Lynford Humphrey, CMA and reviewed by me for thoroughness and accuracy.          Trinna Post, PA-C  [facility]

## 2019-10-08 ENCOUNTER — Telehealth: Payer: Self-pay | Admitting: Physician Assistant

## 2019-10-08 DIAGNOSIS — R062 Wheezing: Secondary | ICD-10-CM

## 2019-10-08 MED ORDER — ALBUTEROL SULFATE HFA 108 (90 BASE) MCG/ACT IN AERS: 2.0000 | INHALATION_SPRAY | Freq: Four times a day (QID) | RESPIRATORY_TRACT | 0 refills | Status: DC | PRN

## 2019-10-08 NOTE — Telephone Encounter (Signed)
Pt needs a refill  Ventolin inhaler  [facility]  CB#  [phone]  [place]

## 2019-12-19 ENCOUNTER — Other Ambulatory Visit: Payer: Self-pay | Admitting: Physician Assistant

## 2019-12-19 DIAGNOSIS — F39 Unspecified mood [affective] disorder: Secondary | ICD-10-CM

## 2019-12-19 DIAGNOSIS — R062 Wheezing: Secondary | ICD-10-CM

## 2020-01-28 ENCOUNTER — Other Ambulatory Visit: Payer: Self-pay | Admitting: Physician Assistant

## 2020-01-28 DIAGNOSIS — F39 Unspecified mood [affective] disorder: Secondary | ICD-10-CM

## 2020-02-03 ENCOUNTER — Other Ambulatory Visit: Payer: Self-pay | Admitting: Physician Assistant

## 2020-02-03 DIAGNOSIS — R062 Wheezing: Secondary | ICD-10-CM

## 2020-04-16 ENCOUNTER — Other Ambulatory Visit: Payer: Self-pay | Admitting: Physician Assistant

## 2020-04-16 DIAGNOSIS — R062 Wheezing: Secondary | ICD-10-CM

## 2020-08-03 ENCOUNTER — Other Ambulatory Visit: Payer: Self-pay | Admitting: Physician Assistant

## 2020-08-03 DIAGNOSIS — R062 Wheezing: Secondary | ICD-10-CM

## 2020-08-03 DIAGNOSIS — F39 Unspecified mood [affective] disorder: Secondary | ICD-10-CM

## 2020-09-06 ENCOUNTER — Other Ambulatory Visit: Payer: Self-pay | Admitting: Physician Assistant

## 2020-09-06 DIAGNOSIS — F39 Unspecified mood [affective] disorder: Secondary | ICD-10-CM

## 2020-09-08 ENCOUNTER — Ambulatory Visit (INDEPENDENT_AMBULATORY_CARE_PROVIDER_SITE_OTHER): Payer: Medicaid Other | Admitting: Physician Assistant

## 2020-09-08 ENCOUNTER — Other Ambulatory Visit: Payer: Self-pay

## 2020-09-08 ENCOUNTER — Other Ambulatory Visit: Payer: Self-pay | Admitting: Physician Assistant

## 2020-09-08 ENCOUNTER — Encounter: Payer: Self-pay | Admitting: Physician Assistant

## 2020-09-08 VITALS — BP 121/84 | HR 81 | Temp 98.2°F | Resp 16 | Wt 183.2 lb

## 2020-09-08 DIAGNOSIS — Z2821 Immunization not carried out because of patient refusal: Secondary | ICD-10-CM | POA: Diagnosis not present

## 2020-09-08 DIAGNOSIS — L409 Psoriasis, unspecified: Secondary | ICD-10-CM

## 2020-09-08 DIAGNOSIS — F39 Unspecified mood [affective] disorder: Secondary | ICD-10-CM | POA: Diagnosis not present

## 2020-09-08 MED ORDER — ESCITALOPRAM OXALATE 10 MG PO TABS: 15.0000 mg | ORAL_TABLET | Freq: Every day | ORAL | 3 refills | Status: AC

## 2020-09-08 NOTE — Telephone Encounter (Signed)
Requested medication (s) are due for refill today - yes  Requested medication (s) are on the active medication list -yes  Future visit scheduled -yes  Last refill: 08/04/20  Notes to clinic: Last Rx sent as courtesy with  note- must have appointment for more RF- patient has appointment today- 3:00pm  Requested Prescriptions  Pending Prescriptions Disp Refills   escitalopram (LEXAPRO) 10 MG tablet [Pharmacy Med Name: ESCITALOPRAM 10 MG TABLET] 30 tablet 0    Sig: TAKE 1 TABLET BY MOUTH EVERY DAY      Psychiatry:  Antidepressants - SSRI Failed - 09/08/2020  8:30 AM      Failed - Completed PHQ-2 or PHQ-9 in the last 360 days.      Failed - Valid encounter within last 6 months    Recent Outpatient Visits           1 year ago Mood disorder [facility])   [facility] [place], Lavella Hammock, [place]   1 year ago Annual physical exam   [facility] Osvaldo Angst M, [place]   3 years ago Irregular menses   [facility] Osvaldo Angst M, [place]   3 years ago Cystitis   [facility] Os[place]do Angst M, [place]   4 years ago Cervicalgia   [facility] [place], Alessandra Bevels, [place]       Future Appointments             Today Rosezetta Schlatter, Alessandra Bevels, PA-C [facility]                Requested Prescriptions  Pending Prescriptions Disp Refills   escitalopram (LEXAPRO) 10 MG tablet [Pharmacy Med Name: ESCITALOPRAM 10 MG TABLET] 30 tablet 0    Sig: TAKE 1 TABLET BY MOUTH EVERY DAY      Psychiatry:  Antidepressants - SSRI Failed - 09/08/2020  8:30 AM      Failed - Completed PHQ-2 or PHQ-9 in the last 360 days.      Failed - Valid encounter within last 6 months    Recent Outpatient Visits           1 year ago Mood disorder [facility])   [facility] [place], Lavella Hammock, [place]   1 year ago Annual physical exam   [facility] Osvaldo Angst M, [place]   3 years ago Irregular menses   [facility] Osvaldo Angst M, [place]   3 years ago Cystitis   [facility] Osvaldo Angst M, [place]   4 years ago Cervicalgia   [facility] [place], Alessandra Bevels, [place]       Future Appointments             Today Rosezetta Schlatter, Alessandra Bevels, PA-C [facility]

## 2020-09-08 NOTE — Progress Notes (Signed)
Established patient visit   Patient: Felicia Daugherty   DOB: 1996/10/16   24 y.o. Female  MRN: [id number] Visit Date: 09/08/2020  Today's healthcare provider: Margaretann Loveless, PA-C   Chief Complaint  Patient presents with  . Mood disorder   Subjective    HPI  Mood Disorder: patient here to follow up. She needs refills on Lexapro 10mg . Reports that she has been out for a week and she is able to tell the differences and she usually stable with her symptoms.  Also has a rash on her scalp. It is itchy and scaly.   Wt Readings from Last 3 Encounters:  09/08/20 183 lb 3.2 oz (83.1 kg)  07/26/19 202 lb 12.8 oz (92 kg)  03/13/19 180 lb (81.6 kg)   Patient Declined influenza vaccine.  Patient Active Problem List   Diagnosis Date Noted  . Post-dates pregnancy 08/28/2017  . Encounter for induction of labor 08/28/2017  . Pregnancy 07/02/2017  . Uterine contractions during pregnancy 07/02/2017  . First trimester screening   . Cervicalgia 06/02/2016  . Bilateral temporomandibular joint pain 06/02/2016  . Irritation of oral cavity 10/18/2015  . Mood disorder (HCC) 10/15/2015  . 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 Anxiety 09/25/2015  . Clinical depression 09/25/2015  . Ileitis 09/25/2015  . Panic attack 09/25/2015  . Allergic rhinitis 05/28/2015  . Dysmenorrhea 05/13/2010   Past Medical History:  Diagnosis Date  . Anemia   . Anxiety   . Depression        Medications: Outpatient Medications Prior to Visit  Medication Sig  . albuterol (VENTOLIN HFA) 108 (90 Base) MCG/ACT inhaler TAKE 2 PUFFS BY MOUTH EVERY 6 HOURS AS NEEDED FOR WHEEZE OR SHORTNESS OF BREATH  . hydrOXYzine (ATARAX/VISTARIL) 10 MG tablet TAKE 1 TABLET BY MOUTH THREE TIMES A DAY AS NEEDED  . escitalopram (LEXAPRO) 10 MG tablet TAKE 1 TABLET BY MOUTH EVERY DAY (Patient not taking: Reported on 09/08/2020)  . ethynodiol-ethinyl estradiol 09/10/2020 1/35) 1-35 MG-MCG tablet Take 1 tablet by mouth daily.  . Prenatal  Vit-Fe Fumarate-FA (MULTIVITAMIN-PRENATAL) 27-0.8 MG TABS tablet Take 1 tablet by mouth daily at 12 noon.  . topiramate (TOPAMAX) 50 MG tablet Take 0.5 tablets (25 mg total) by mouth at bedtime for 7 days, THEN 1 tablet (50 mg total) at bedtime for 7 days, THEN 1.5 tablets (75 mg total) at bedtime for 7 days, THEN 2 tablets (100 mg total) at bedtime for 7 days.   No facility-administered medications prior to visit.    Review of Systems  Constitutional: Negative.   Respiratory: Negative.   Cardiovascular: Negative.   Skin: Positive for rash.  Psychiatric/Behavioral: Positive for dysphoric mood. The patient is nervous/anxious.     Last CBC Lab Results  Component Value Date   WBC 13.2 (H) 03/13/2019   HGB 14.7 03/13/2019   HCT 43.2 03/13/2019   MCV 89.1 03/13/2019   MCH 30.3 03/13/2019   RDW 13.4 03/13/2019   PLT 385 03/13/2019   Last metabolic panel Lab Results  Component Value Date   GLUCOSE 108 (H) 03/13/2019   NA 137 03/13/2019   K 3.2 (L) 03/13/2019   CL 102 03/13/2019   CO2 20 (L) 03/13/2019   BUN 12 03/13/2019   CREATININE 0.53 03/13/2019   GFRNONAA >60 03/13/2019   GFRAA >60 03/13/2019   CALCIUM 9.1 03/13/2019   PROT 8.5 (H) 03/13/2019   ALBUMIN 4.4 03/13/2019   LABGLOB 2.8 09/26/2015   AGRATIO 1.5 09/26/2015  BILITOT 0.8 03/13/2019   ALKPHOS 68 03/13/2019   AST 24 03/13/2019   ALT 20 03/13/2019   ANIONGAP 15 03/13/2019      Objective    BP 121/84 (BP Location: Left Arm, Patient Position: Sitting, Cuff Size: Large)   Pulse 81   Temp 98.2 F (36.8 C) (Oral)   Resp 16   Wt 183 lb 3.2 oz (83.1 kg)   BMI 31.45 kg/m  BP Readings from Last 3 Encounters:  09/08/20 121/84  07/26/19 116/70  03/13/19 (!) 152/83   Wt Readings from Last 3 Encounters:  09/08/20 183 lb 3.2 oz (83.1 kg)  07/26/19 202 lb 12.8 oz (92 kg)  03/13/19 180 lb (81.6 kg)      Physical Exam Vitals reviewed.  Constitutional:      General: She is not in acute distress.     Appearance: Normal appearance. She is well-developed and normal weight. She is not ill-appearing or diaphoretic.  Cardiovascular:     Rate and Rhythm: Normal rate and regular rhythm.     Heart sounds: Normal heart sounds. No murmur heard.  No friction rub. No gallop.   Pulmonary:     Effort: Pulmonary effort is normal. No respiratory distress.     Breath sounds: Normal breath sounds. No wheezing or rales.  Musculoskeletal:     Cervical back: Normal range of motion and neck supple.  Skin:      Neurological:     Mental Status: She is alert.  Psychiatric:        Mood and Affect: Mood normal.       No results found for any visits on 09/08/20.  Assessment & Plan     1. Mood disorder (HCC) Stable. Diagnosis pulled for medication refill. Continue current medical treatment plan. - escitalopram (LEXAPRO) 10 MG tablet; Take 1.5 tablets (15 mg total) by mouth at bedtime.  Dispense: 135 tablet; Refill: 3  2. Psoriasis New finding. Will try clobetasol solution as below. Call if not improving and can refer to dermatology. - clobetasol (TEMOVATE) 0.05 % external solution; Apply 1 application topically daily.  Dispense: 50 mL; Refill: 5  3. Influenza vaccination declined   No follow-ups on file.      Delmer Islam, PA-C, have reviewed all documentation for this visit. The documentation on 09/09/20 for the exam, diagnosis, procedures, and orders are all accurate and complete.   Reine Just  [facility] [phone] (phone) [phone] (fax)  [facility]

## 2020-09-08 NOTE — Patient Instructions (Signed)
10 Relaxation Techniques That Zap Stress Fast By Jeannette Moninger   Listen  Relax. You deserve it, it's good for you, and it takes less time than you think. You don't need a spa weekend or a retreat. Each of these stress-relieving tips can get you from OMG to om in less than 15 minutes. 1. Meditate  A few minutes of practice per day can help ease anxiety. "Research suggests that daily meditation may alter the brain's neural pathways, making you more resilient to stress," says psychologist Robbie Maller Hartman, PhD, a [place] health and wellness coach. It's simple. Sit up straight with both feet on the floor. Close your eyes. Focus your attention on reciting -- out loud or silently -- a positive mantra such as "I feel at peace" or "I love myself." Place one hand on your belly to sync the mantra with your breaths. Let any distracting thoughts float by like clouds. 2. Breathe Deeply  Take a 5-minute break and focus on your breathing. Sit up straight, eyes closed, with a hand on your belly. Slowly inhale through your nose, feeling the breath start in your abdomen and work its way to the top of your head. Reverse the process as you exhale through your mouth.  "Deep breathing counters the effects of stress by slowing the heart rate and lowering blood pressure," psychologist Judith Tutin, PhD, says. She's a certified life coach in [place], [place] 3. Be Present  Slow down.  "Take 5 minutes and focus on only one behavior with awareness," Tutin says. Notice how the air feels on your face when you're walking and how your feet feel hitting the ground. Enjoy the texture and taste of each bite of food. When you spend time in the moment and focus on your senses, you should feel less tense. 4. Reach Out  Your social network is one of your best tools for handling stress. Talk to others -- preferably face to face, or at least on the phone. Share what's going on. You can get a fresh perspective while keeping your  connection strong. 5. Tune In to Your Body  Mentally scan your body to get a sense of how stress affects it each day. Lie on your back, or sit with your feet on the floor. Start at your toes and work your way up to your scalp, noticing how your body feels.  "Simply be aware of places you feel tight or loose without trying to change anything," Tutin says. For 1 to 2 minutes, imagine each deep breath flowing to that body part. Repeat this process as you move your focus up your body, paying close attention to sensations you feel in each body part. 6. Decompress  Place a warm heat wrap around your neck and shoulders for 10 minutes. Close your eyes and relax your face, neck, upper chest, and back muscles. Remove the wrap, and use a tennis ball or foam roller to massage away tension.  "Place the ball between your back and the wall. Lean into the ball, and hold gentle pressure for up to 15 seconds. Then move the ball to another spot, and apply pressure," says Cathy Benninger, a nurse practitioner and assistant professor at [facility] [employer] [facility] in [place]. 7. Laugh Out Loud  A good belly laugh doesn't just lighten the load mentally. It lowers cortisol, your body's stress hormone, and boosts brain chemicals called endorphins, which help your mood. Lighten up by tuning in to your favorite sitcom or video, reading   the comics, or chatting with someone who makes you smile. 8. Crank Up the Tunes  Research shows that listening to soothing music can lower blood pressure, heart rate, and anxiety. "Create a playlist of songs or nature sounds (the ocean, a bubbling brook, birds chirping), and allow your mind to focus on the different melodies, instruments, or singers in the piece," Benninger says. You also can blow off steam by rocking out to more upbeat tunes -- or singing at the top of your lungs! 9. Get Moving  You don't have to run in order to get a runner's high. All forms of exercise,  including yoga and walking, can ease depression and anxiety by helping the brain release feel-good chemicals and by giving your body a chance to practice dealing with stress. You can go for a quick walk around the block, take the stairs up and down a few flights, or do some stretching exercises like head rolls and shoulder shrugs. 10. Be Grateful  Keep a gratitude journal or several (one by your bed, one in your purse, and one at work) to help you remember all the things that are good in your life.  "Being grateful for your blessings cancels out negative thoughts and worries," says Joni Emmerling, a wellness coach in [place], [place].  Use these journals to savor good experiences like a child's smile, a sunshine-filled day, and good health. Don't forget to celebrate accomplishments like mastering a new task at work or a new hobby. When you start feeling stressed, spend a few minutes looking through your notes to remind yourself what really matters.   

## 2020-09-09 ENCOUNTER — Telehealth: Payer: Self-pay

## 2020-09-09 ENCOUNTER — Encounter: Payer: Self-pay | Admitting: Physician Assistant

## 2020-09-09 MED ORDER — CLOBETASOL PROPIONATE 0.05 % EX SOLN: 1.0000 "application " | Freq: Every day | CUTANEOUS | 5 refills | Status: DC

## 2020-09-09 NOTE — Telephone Encounter (Signed)
Copied from CRM [id number]. Topic: General - Other >> Sep 09, 2020 11:18 AM Randol Kern wrote: Pt called requesting a doctor's note that excuses her from class from 09/02/2020 until 09/09/2020. Pt had severe vertigo, was off of my medication. Best contact: [phone]

## 2020-09-09 NOTE — Telephone Encounter (Signed)
Sent to [facility] 

## 2020-09-09 NOTE — Telephone Encounter (Signed)
Copied from CRM [id number]. Topic: General - Other >> Sep 08, 2020  4:33 PM Dalphine Handing A wrote: Patient is wanting her psoriasis medication sent in and cannot recall the name of the medication. Patient wanting a callback once medication has been sent over. Please advise

## 2020-09-09 NOTE — Telephone Encounter (Signed)
Work note sent through mychart 

## 2020-09-26 ENCOUNTER — Other Ambulatory Visit: Payer: Self-pay | Admitting: Internal Medicine

## 2020-09-26 ENCOUNTER — Ambulatory Visit: Payer: Medicaid Other | Attending: Internal Medicine

## 2020-09-26 DIAGNOSIS — Z23 Encounter for immunization: Secondary | ICD-10-CM

## 2020-09-26 NOTE — Progress Notes (Signed)
   Covid-19 Vaccination Clinic  Name:  Felicia Daugherty    MRN: [id number] DOB: 1996/01/22  09/26/2020  [name] was observed post Covid-19 immunization for 15 minutes without incident. She was provided with Vaccine Information Sheet and instruction to access the V-Safe system.   [name] was instructed to call 911 with any severe reactions post vaccine: Marland Kitchen Difficulty breathing  . Swelling of face and throat  . A fast heartbeat  . A bad rash all over body  . Dizziness and weakness   Immunizations Administered    Name Date Dose VIS Date Route   Pfizer COVID-19 Vaccine 09/26/2020 11:28 AM 0.3 mL 02/06/2019 Intramuscular   Manufacturer: [employer], [employer]   Lot: [id number]   NDC: [address]-6

## 2020-10-05 ENCOUNTER — Other Ambulatory Visit: Payer: Self-pay | Admitting: Physician Assistant

## 2020-10-05 DIAGNOSIS — F39 Unspecified mood [affective] disorder: Secondary | ICD-10-CM

## 2020-10-05 DIAGNOSIS — R062 Wheezing: Secondary | ICD-10-CM

## 2020-10-05 NOTE — Telephone Encounter (Signed)
Requested Prescriptions  Pending Prescriptions Disp Refills   PROAIR HFA 108 (90 Base) MCG/ACT inhaler [Pharmacy Med Name: PROAIR HFA 90 MCG INHALER] 8.5 each 1    Sig: TAKE 2 PUFFS BY MOUTH EVERY 6 HOURS AS NEEDED FOR WHEEZE OR SHORTNESS OF BREATH     Pulmonology:  Beta Agonists Failed - 10/05/2020  4:33 PM      Failed - One inhaler should last at least one month. If the patient is requesting refills earlier, contact the patient to check for uncontrolled symptoms.      Passed - Valid encounter within last 12 months    Recent Outpatient Visits          3 weeks ago Mood disorder [facility]   [facility] [place], Alessandra Bevels, [place]   1 year ago Mood disorder [facility])   [facility] [place], Lavella Hammock, [place]   1 year ago Annual physical exam   [facility] Osvaldo Angst M, [place]   3 years ago Irregular menses   [facility] Osvaldo Angst M, [place]   4 years ago Cystitis   [facility] Osvaldo Angst [place], [place]

## 2020-10-05 NOTE — Telephone Encounter (Signed)
Requested Prescriptions  Pending Prescriptions Disp Refills  . hydrOXYzine (ATARAX/VISTARIL) 10 MG tablet [Pharmacy Med Name: HYDROXYZINE HCL 10 MG TABLET] 30 tablet 0    Sig: TAKE 1 TABLET BY MOUTH THREE TIMES A DAY AS NEEDED     Ear, Nose, and Throat:  Antihistamines Passed - 10/05/2020  4:33 PM      Passed - Valid encounter within last 12 months    Recent Outpatient Visits          3 weeks ago Mood disorder [facility])   [facility] [place], Alessandra Bevels, PA-C   1 year ago Mood disorder [facility])   [facility], Lavella Hammock, [place]   1 year ago Annual physical exam   [facility] Osvaldo Angst M, [place]   3 years ago Irregular menses   [facility] [place], [place], [place]   4 years ago Cystitis   [facility], Ricki Rodriguez [place], [place]

## 2020-10-21 ENCOUNTER — Ambulatory Visit: Payer: Medicaid Other | Attending: Internal Medicine

## 2020-10-21 ENCOUNTER — Other Ambulatory Visit: Payer: Self-pay | Admitting: Internal Medicine

## 2020-10-21 DIAGNOSIS — Z23 Encounter for immunization: Secondary | ICD-10-CM

## 2020-10-21 NOTE — Progress Notes (Signed)
   Covid-19 Vaccination Clinic  Name:  Felicia Daugherty    MRN: [id number] DOB: [birth year]  10/21/2020  [name] was observed post Covid-19 immunization for 15 minutes without incident. She was provided with Vaccine Information Sheet and instruction to access the V-Safe system.   [name] was instructed to call 911 with any severe reactions post vaccine: Marland Kitchen Difficulty breathing  . Swelling of face and throat  . A fast heartbeat  . A bad rash all over body  . Dizziness and weakness   Immunizations Administered    Name Date Dose VIS Date Route   Pfizer COVID-19 Vaccine 10/21/2020 11:18 AM 0.3 mL 10/01/2020 Intramuscular   Manufacturer: [employer], [employer]   Lot: [id number]   NDC: [address]-1

## 2021-02-03 ENCOUNTER — Other Ambulatory Visit: Payer: Self-pay | Admitting: Physician Assistant

## 2021-02-03 DIAGNOSIS — L409 Psoriasis, unspecified: Secondary | ICD-10-CM

## 2021-02-03 DIAGNOSIS — F39 Unspecified mood [affective] disorder: Secondary | ICD-10-CM

## 2021-02-03 NOTE — Telephone Encounter (Signed)
Requested medication (s) are due for refill today:   Yes  Requested medication (s) are on the active medication list:   Yes  Future visit scheduled:   No   Last ordered: 09/09/2020 50 ml, 5 refills  Clinic note:  Returned because pharmacy requesting an alternative.   See attached request.   Requested Prescriptions  Pending Prescriptions Disp Refills   clobetasol (TEMOVATE) 0.05 % external solution [Pharmacy Med Name: CLOBETASOL 0.05% SOLUTION] 50 mL 5    Sig: APPLY TO AFFECTED AREA EVERY DAY      Dermatology:  Corticosteroids Passed - 02/03/2021  1:24 PM      Passed - Valid encounter within last 12 months    Recent Outpatient Visits           4 months ago Mood disorder [facility])   [facility] [place], Alessandra Bevels, [place]   1 year ago Mood disorder [facility])   [facility] [place], Lavella Hammock, [place]   1 year ago Annual physical exam   [facility] Osvaldo Angst M, [place]   4 years ago Irregular menses   [facility] [place], [place], [place]   4 years ago Cystitis   [facility] [place], Ricki Rodriguez [place], [place]

## 2021-03-18 ENCOUNTER — Other Ambulatory Visit: Payer: Self-pay | Admitting: Physician Assistant

## 2021-03-18 DIAGNOSIS — R062 Wheezing: Secondary | ICD-10-CM

## 2021-05-26 ENCOUNTER — Ambulatory Visit: Payer: Medicaid Other | Admitting: Family Medicine

## 2021-06-25 ENCOUNTER — Emergency Department: Payer: Medicaid Other

## 2021-06-25 ENCOUNTER — Other Ambulatory Visit: Payer: Self-pay

## 2021-06-25 ENCOUNTER — Emergency Department
Admission: EM | Admit: 2021-06-25 | Discharge: 2021-06-25 | Disposition: A | Discharge status: Home / Self Care | Payer: Medicaid Other | Attending: Emergency Medicine | Admitting: Emergency Medicine

## 2021-06-25 DIAGNOSIS — R102 Pelvic and perineal pain: Secondary | ICD-10-CM | POA: Diagnosis not present

## 2021-06-25 DIAGNOSIS — Z3A01 Less than 8 weeks gestation of pregnancy: Secondary | ICD-10-CM | POA: Insufficient documentation

## 2021-06-25 DIAGNOSIS — Z3201 Encounter for pregnancy test, result positive: Secondary | ICD-10-CM | POA: Diagnosis not present

## 2021-06-25 DIAGNOSIS — O039 Complete or unspecified spontaneous abortion without complication: Secondary | ICD-10-CM

## 2021-06-25 DIAGNOSIS — N8312 Corpus luteum cyst of left ovary: Secondary | ICD-10-CM | POA: Diagnosis not present

## 2021-06-25 DIAGNOSIS — Z87891 Personal history of nicotine dependence: Secondary | ICD-10-CM | POA: Diagnosis not present

## 2021-06-25 DIAGNOSIS — O3481 Maternal care for other abnormalities of pelvic organs, first trimester: Secondary | ICD-10-CM | POA: Diagnosis not present

## 2021-06-25 DIAGNOSIS — O26891 Other specified pregnancy related conditions, first trimester: Secondary | ICD-10-CM | POA: Diagnosis not present

## 2021-06-25 LAB — HCG, QUANTITATIVE, PREGNANCY: hCG, Beta Chain, Quant, S: 2391 m[IU]/mL — ABNORMAL HIGH (ref <5)

## 2021-06-25 LAB — POC URINE PREG, ED: Preg Test, Ur: POSITIVE — AB

## 2021-06-25 NOTE — ED Triage Notes (Signed)
Pt states she had a miscarriage a month ago , passed clots and had bleeding for a week, states she took a pregnancy test after that was negative, states today she took another and it was positive and just wants to make sure if this is from the previous pregnancy or if this is a new pregnancy

## 2021-06-25 NOTE — ED Provider Notes (Signed)
[facility] Emergency Department Provider Note  ____________________________________________   Event Date/Time   First MD Initiated Contact with Patient 06/25/21 1114     (approximate)  I have reviewed the triage vital signs and the nursing notes.   HISTORY  Chief Complaint Possible Pregnancy    HPI Felicia Daugherty is a 25 y.o. female presents to the ED with concerns of possible pregnancy.  Patient states that she had a miscarriage on June 8 passing a lot of clots and bleeding for a week.  She called her PCP who told her that it was not necessary for her to have an office visit at that time since it sounded like she was miscarrying.  Patient states that she took a pregnancy test which was positive.  She presents hoping to find out if this is a new pregnancy.  Currently she is not experiencing any cramping or bleeding at this time.         Past Medical History:  Diagnosis Date   Anemia    Anxiety    Depression     Patient Active Problem List   Diagnosis Date Noted   Post-dates pregnancy 08/28/2017   Encounter for induction of labor 08/28/2017   Pregnancy 07/02/2017   Uterine contractions during pregnancy 07/02/2017   First trimester screening    Cervicalgia 06/02/2016   Bilateral temporomandibular joint pain 06/02/2016   Irritation of oral cavity 10/18/2015   Mood disorder (HCC) 10/15/2015   Absolute anemia 09/25/2015   Anxiety 09/25/2015   Clinical depression 09/25/2015   Ileitis 09/25/2015   Panic attack 09/25/2015   Allergic rhinitis 05/28/2015   Dysmenorrhea 05/13/2010    Past Surgical History:  Procedure Laterality Date   TONSILLECTOMY AND ADENOIDECTOMY     at age 66    Prior to Admission medications   Medication Sig Start Date End Date Taking? Authorizing Provider  clobetasol (TEMOVATE) 0.05 % external solution APPLY TO AFFECTED AREA EVERY DAY 02/03/21   Margaretann Loveless, PA-C  COVID-19 mRNA vaccine, Pfizer, 30 MCG/0.3ML  injection USE AS DIRECTED 10/21/20 10/21/21  Judyann Munson, MD  COVID-19 mRNA vaccine, Pfizer, 30 MCG/0.3ML injection USE AS DIRECTED 09/26/20 09/26/21  Judyann Munson, MD  escitalopram (LEXAPRO) 10 MG tablet Take 1.5 tablets (15 mg total) by mouth at bedtime. 09/08/20   Margaretann Loveless, PA-C  hydrOXYzine (ATARAX/VISTARIL) 10 MG tablet TAKE 1 TABLET BY MOUTH THREE TIMES A DAY AS NEEDED 02/03/21   Margaretann Loveless, PA-C  PROAIR HFA 108 [phone] Base) MCG/ACT inhaler TAKE 2 PUFFS BY MOUTH EVERY 6 HOURS AS NEEDED FOR WHEEZE OR SHORTNESS OF BREATH 03/18/21   Margaretann Loveless, PA-C    Allergies Augmentin [amoxicillin-pot clavulanate]  Family History  Problem Relation Age of Onset   Hypertension Father    Cancer Maternal Grandmother        Breast   Hypothyroidism Maternal Grandmother    Lupus Maternal Grandmother    Hypothyroidism Mother    Hypothyroidism Brother    Diabetes Maternal Grandfather    Lupus Maternal Aunt    Diabetes Maternal Uncle     Social History Social History   Tobacco Use   Smoking status: Former    Packs/day: 0.25    Years: 3.00    Pack years: 0.75    Types: Cigarettes    Quit date: 01/13/2017    Years since quitting: 4.4   Smokeless tobacco: Never  Substance Use Topics   Alcohol use: No   Drug use: No  Review of Systems Constitutional: No fever/chills Eyes: No visual changes. ENT: No sore throat. Cardiovascular: Denies chest pain. Respiratory: Denies shortness of breath. Gastrointestinal: No abdominal pain.  No nausea, no vomiting.  Positive diarrhea.  No constipation. Genitourinary: Negative for dysuria.  Possible pregnancy per patient. Musculoskeletal: Negative for musculoskeletal pain. Skin: Negative for rash. Neurological: Negative for headaches, focal weakness or numbness. ____________________________________________   PHYSICAL EXAM:  VITAL SIGNS: ED Triage Vitals  Enc Vitals Group     BP 06/25/21 1050 133/80     Pulse Rate  06/25/21 1050 (!) 112     Resp 06/25/21 1050 18     Temp 06/25/21 1050 98.6 F (37 C)     Temp Source 06/25/21 1050 Oral     SpO2 06/25/21 1050 100 %     Weight 06/25/21 1048 183 lb 3.2 oz (83.1 kg)     Height 06/25/21 1048 5\' 4"  (1.626 m)     Head Circumference --      Peak Flow --      Pain Score 06/25/21 1039 0     Pain Loc --      Pain Edu? --      Excl. in GC? --     Constitutional: Alert and oriented. Well appearing and in no acute distress. Eyes: Conjunctivae are normal.  Head: Atraumatic. Neck: No stridor.   Cardiovascular: Normal rate, regular rhythm. Grossly normal heart sounds.  Good peripheral circulation. Respiratory: Normal respiratory effort.  No retractions. Lungs CTAB. Gastrointestinal: Soft and nontender. No distention.  No CVA tenderness. Musculoskeletal: No lower extremity tenderness nor edema.  No joint effusions. Neurologic:  Normal speech and language. No gross focal neurologic deficits are appreciated. No gait instability. Skin:  Skin is warm, dry and intact. No rash noted. Psychiatric: Mood and affect are normal. Speech and behavior are normal.  ____________________________________________   LABS (all labs ordered are listed, but only abnormal results are displayed)  Labs Reviewed  HCG, QUANTITATIVE, PREGNANCY - Abnormal; Notable for the following components:      Result Value   hCG, Beta Chain, Quant, S 2,391 (*)    All other components within normal limits  POC URINE PREG, ED - Abnormal; Notable for the following components:   Preg Test, Ur Positive (*)    All other components within normal limits   ___________________________________________  RADIOLOGY I, 05-22-1979, personally viewed and evaluated these images (plain radiographs) as part of my medical decision making, as well as reviewing the written report by the radiologist.   Official radiology report(s): Tommi Rumps OB LESS THAN 14 WEEKS WITH OB TRANSVAGINAL  Result Date:  06/25/2021 CLINICAL DATA:  History of spontaneous abortion on 05/20/2021. Positive pregnancy test. EXAM: OBSTETRIC <14 WK 07/20/2021 AND TRANSVAGINAL OB US TECHNIQUE: Both transabdominal and transvaginal ultrasound examinations were performed for complete evaluation of the gestation as well as the maternal uterus, adnexal regions, and pelvic cul-de-sac. Transvaginal technique was performed to assess early pregnancy. COMPARISON:  None. FINDINGS: Intrauterine gestational sac: Single Yolk sac:  None Embryo:  None Cardiac Activity: None Heart Rate: N/A bpm MSD: 4.0 mm   5 w   1 d Subchorionic hemorrhage:  None visualized. Normal right ovary.  Corpus luteum cyst left ovary. Maternal uterus/adnexae: Normal right ovary. Corpus luteum cyst left ovary. Small amount of free pelvic fluid. IMPRESSION: 1. Small intrauterine gestational sac estimated at 5 weeks and 1 days gestation but no yolk sac, fetal pole, or cardiac activity yet visualized. Recommend follow-up quantitative B-HCG levels  and follow-up US in 14 days to assess viability. This recommendation follows SRU consensus guidelines: Diagnostic Criteria for Nonviable Pregnancy Early in the First Trimester. Malva Limes Med 2013; 572:6203-55. 2. Corpus luteum cyst left ovary. 3. Small amount of free pelvic fluid. Electronically Signed   By: Rudie Meyer M.D.   On: 06/25/2021 13:14    ____________________________________________   PROCEDURES  Procedure(s) performed (including Critical Care):  Procedures   ____________________________________________   INITIAL IMPRESSION / ASSESSMENT AND PLAN / ED COURSE  As part of my medical decision making, I reviewed the following data within the electronic MEDICAL RECORD NUMBER Notes from prior ED visits and [place] Controlled Substance Database  25 year old female presents to the ED with questionable pregnancy.  Patient states that she had a miscarriage on 05/20/2021.  Patient states that she took a pregnancy test at home which was  negative but then this morning took a pregnancy test that was positive.  Patient is confused if this is a new pregnancy or showing positive from her previous pregnancy.  She denies any vaginal or abdominal pain at this time.  Urinalysis was positive and hCG was 2391.  Ultrasound shows a single IUP measuring 5 weeks and 1 day.  Patient is encouraged to follow-up with her OB/GYN or PCP.  I called patient at home also advised her that it is suggested that she have a repeat beta and ultrasound in 14 days. ____________________________________________   FINAL CLINICAL IMPRESSION(S) / ED DIAGNOSES  Final diagnoses:  Less than [redacted] weeks gestation of pregnancy     ED Discharge Orders     None        Note:  This document was prepared using Dragon voice recognition software and may include unintentional dictation errors.    Tommi Rumps, PA-C 06/25/21 1444    Minna Antis, MD 06/25/21 1447

## 2021-06-25 NOTE — Discharge Instructions (Addendum)
Follow-up with your primary care provider or OB/GYN.  Today's ultrasound shows a single pregnancy measuring 5 weeks 1 day.

## 2021-06-26 ENCOUNTER — Telehealth: Payer: Self-pay

## 2021-06-26 NOTE — Telephone Encounter (Signed)
Transition Care Management Unsuccessful Follow-up Telephone Call  Date of discharge and from where:  06/25/2021 [facility]  Attempts:  1st Attempt  Reason for unsuccessful TCM follow-up call:  Left voice message    

## 2021-06-29 NOTE — Telephone Encounter (Signed)
Transition Care Management Unsuccessful Follow-up Telephone Call  Date of discharge and from where:  06/25/2021[facility]  Attempts:  2nd Attempt  Reason for unsuccessful TCM follow-up call:  Unable to reach patient

## 2021-06-30 NOTE — Telephone Encounter (Signed)
Transition Care Management Unsuccessful Follow-up Telephone Call  Date of discharge and from where:  06/25/2021[facility]  Attempts:  3rd Attempt  Reason for unsuccessful TCM follow-up call:  Unable to reach patient

## 2021-07-06 DIAGNOSIS — O3680X0 Pregnancy with inconclusive fetal viability, not applicable or unspecified: Secondary | ICD-10-CM | POA: Diagnosis not present

## 2021-07-08 DIAGNOSIS — O3680X0 Pregnancy with inconclusive fetal viability, not applicable or unspecified: Secondary | ICD-10-CM | POA: Diagnosis not present

## 2021-07-10 DIAGNOSIS — O3680X0 Pregnancy with inconclusive fetal viability, not applicable or unspecified: Secondary | ICD-10-CM | POA: Diagnosis not present

## 2021-08-03 DIAGNOSIS — Z113 Encounter for screening for infections with a predominantly sexual mode of transmission: Secondary | ICD-10-CM | POA: Diagnosis not present

## 2021-08-03 DIAGNOSIS — Z3481 Encounter for supervision of other normal pregnancy, first trimester: Secondary | ICD-10-CM | POA: Diagnosis not present

## 2021-08-03 DIAGNOSIS — Z6831 Body mass index (BMI) 31.0-31.9, adult: Secondary | ICD-10-CM | POA: Diagnosis not present

## 2021-08-03 DIAGNOSIS — Z124 Encounter for screening for malignant neoplasm of cervix: Secondary | ICD-10-CM | POA: Diagnosis not present

## 2023-06-16 IMAGING — US US OB < 14 WEEKS - US OB TV
1 series · 15 of 28 positions shown · non-contrast
Comparison: None.

CLINICAL DATA: History of spontaneous abortion on 05/20/2021.
Positive pregnancy test.

EXAM:
OBSTETRIC <14 WK US AND TRANSVAGINAL OB US
TECHNIQUE: Both transabdominal and transvaginal ultrasound examinations were
performed for complete evaluation of the gestation as well as the
maternal uterus, adnexal regions, and pelvic cul-de-sac.
Transvaginal technique was performed to assess early pregnancy.

[Series 1: us ob comp less 14 wks · 15 of 107 slices shown]
[im 1/107]
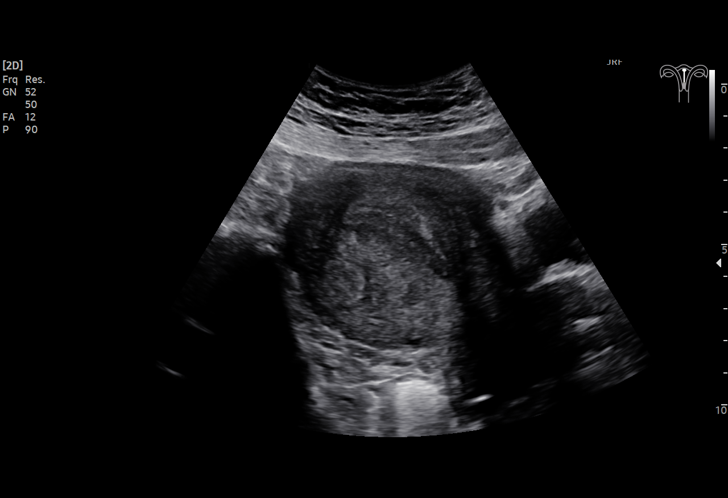
[im 8/107]
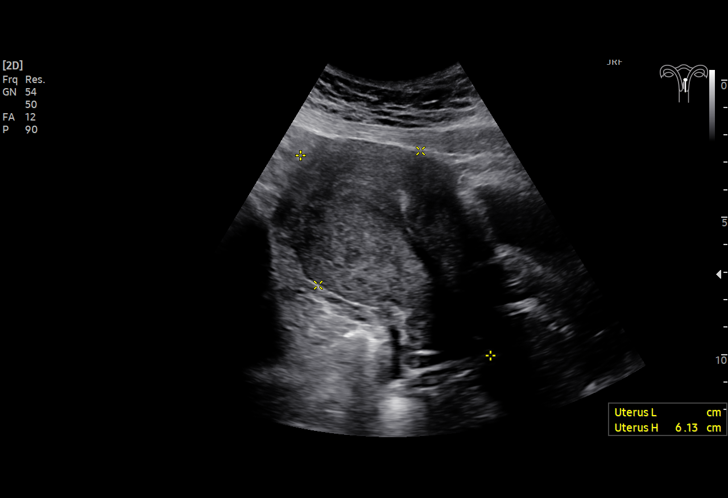
[im 16/107]
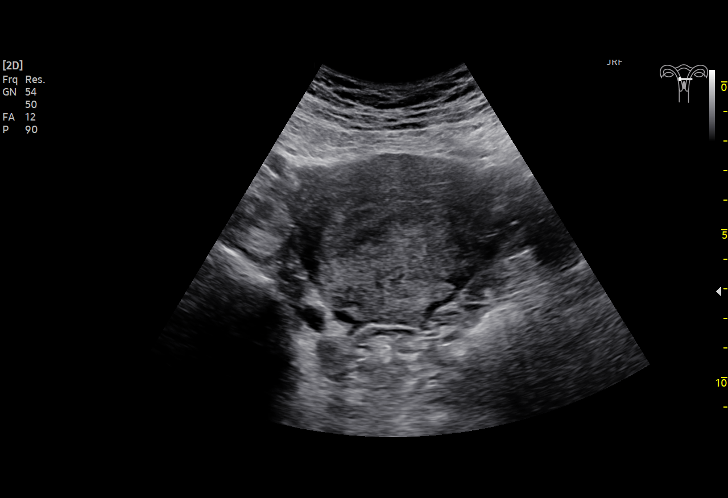
[im 24/107]
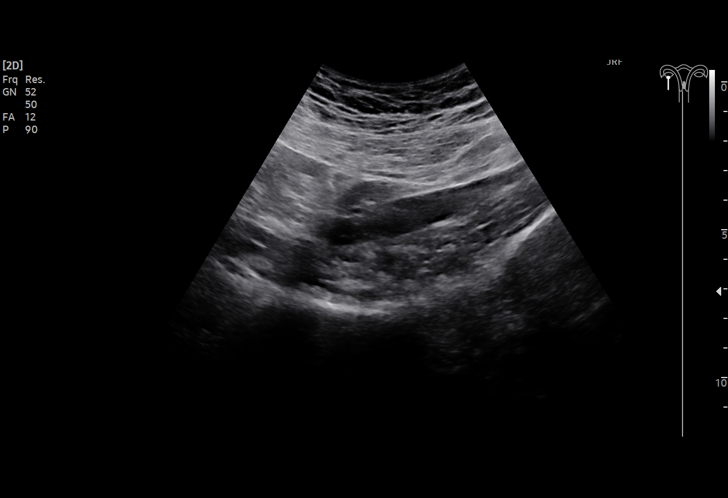
[im 32/107]
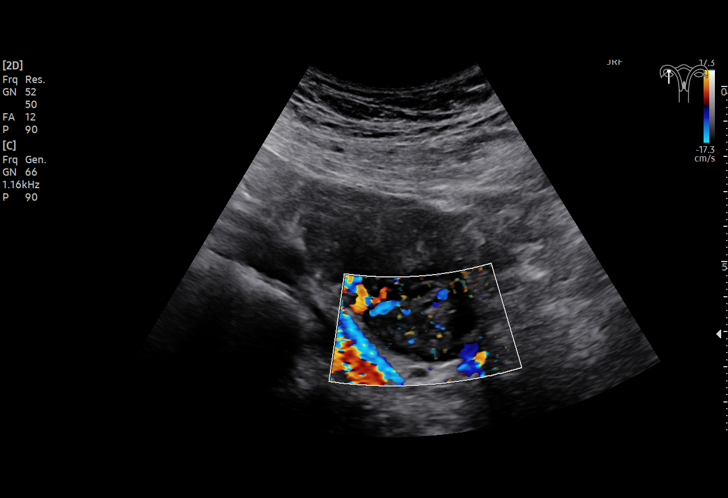
[im 40/107]
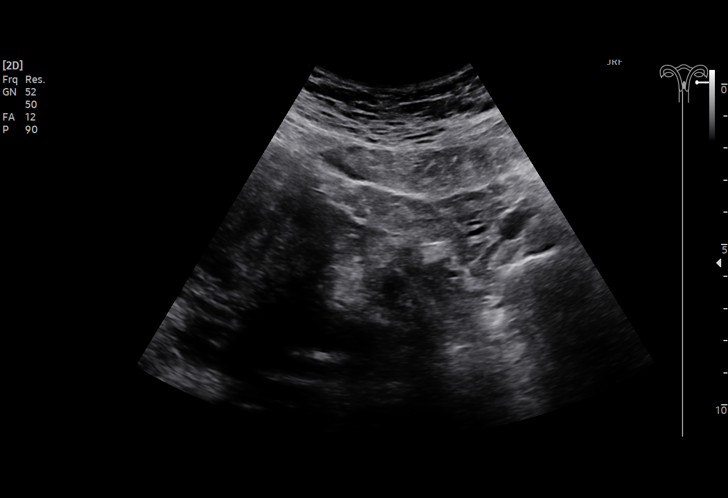
[im 48/107]
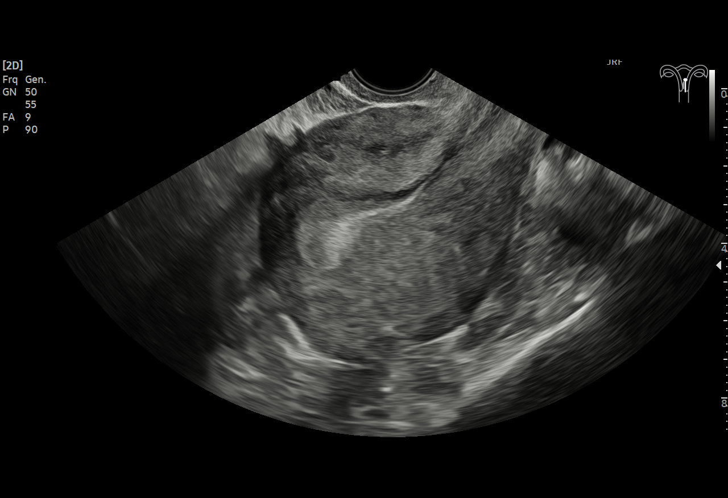
[im 55/107]
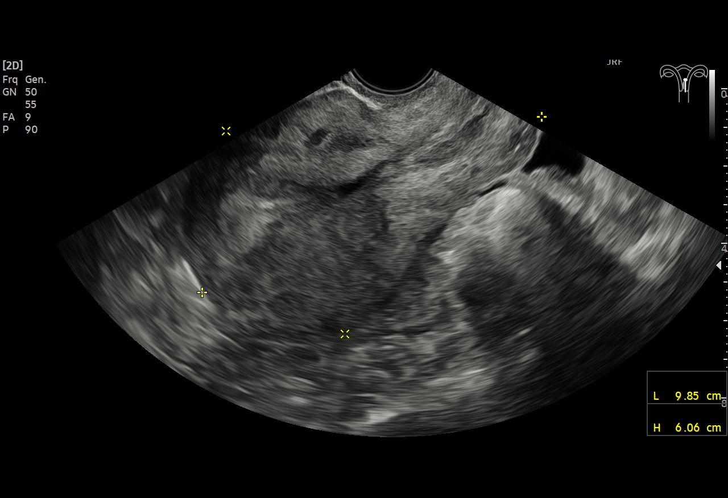
[im 59/107]
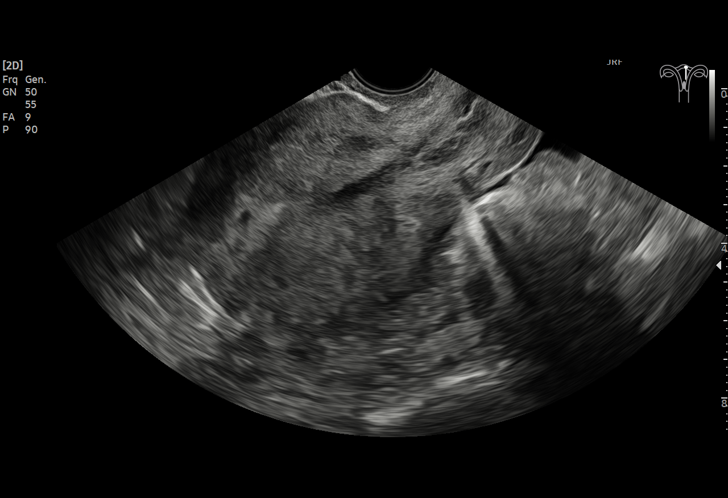
[im 67/107]
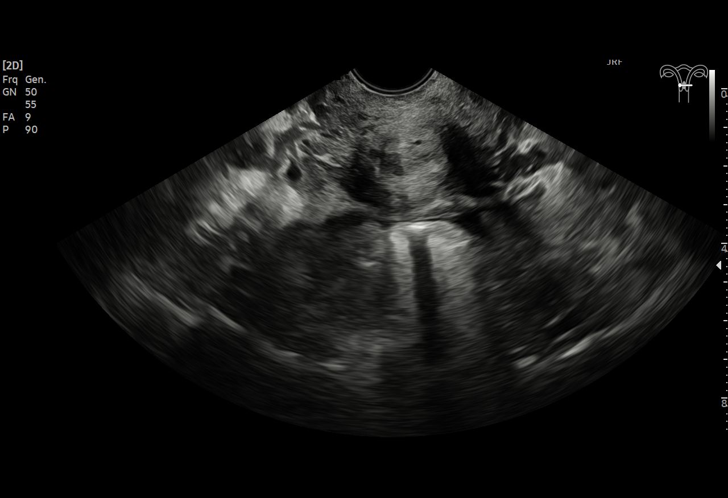
[im 75/107]
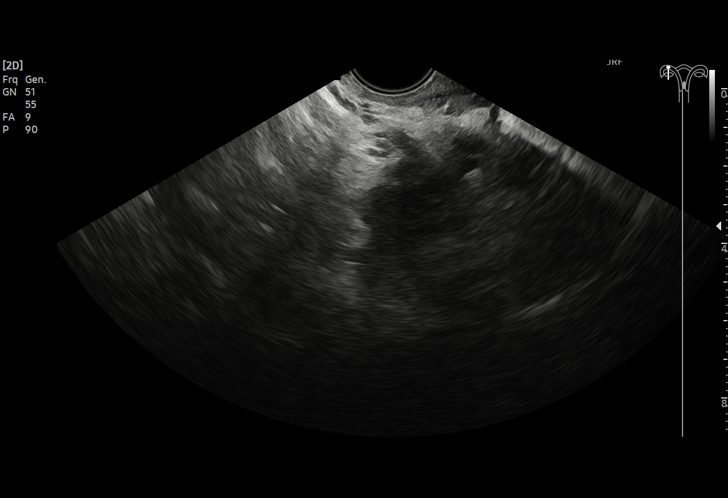
[im 83/107]
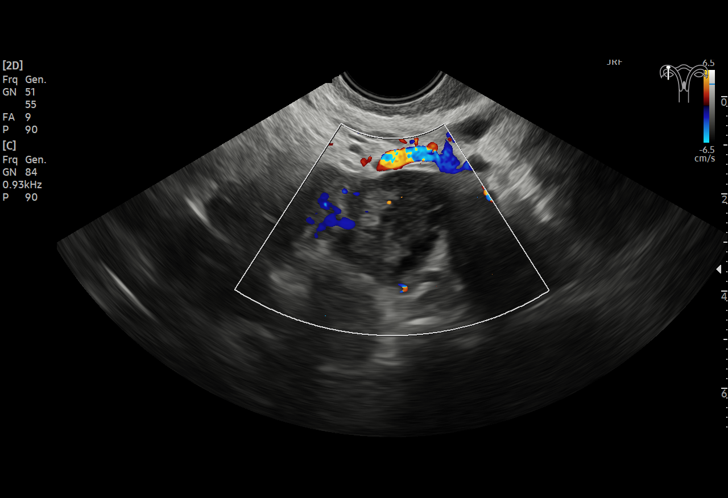
[im 91/107]
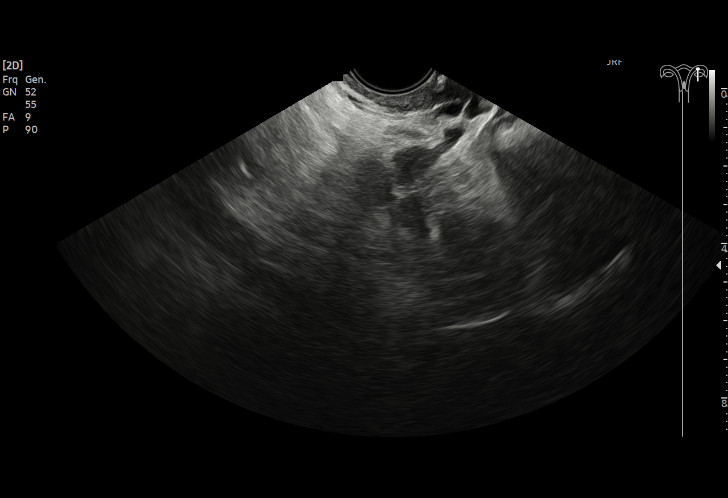
[im 99/107]
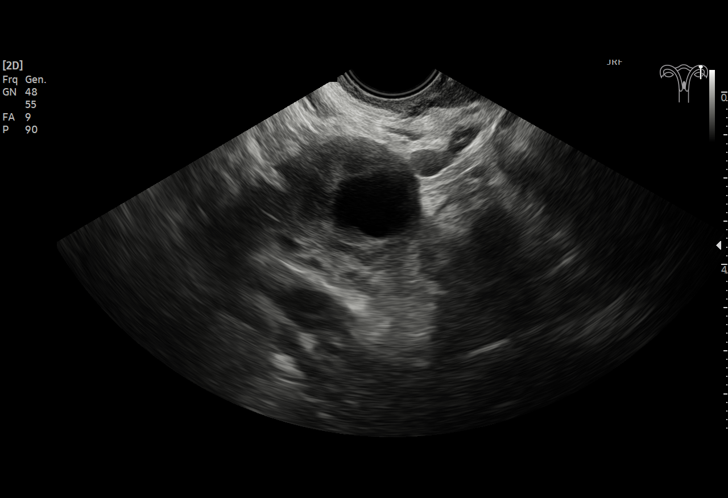
[im 107/107]
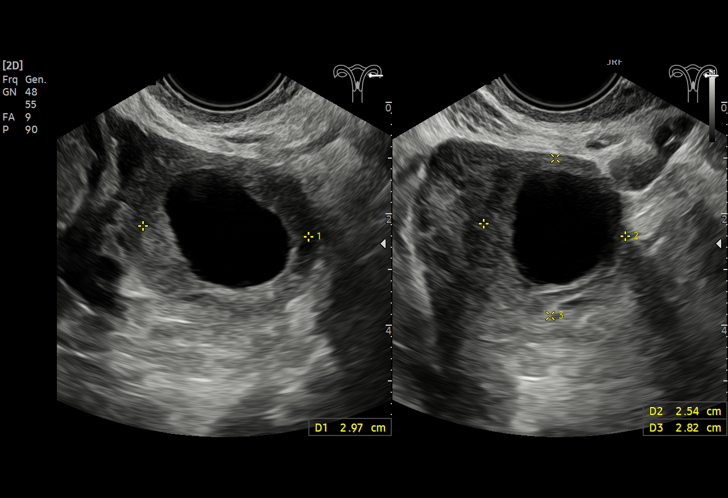

[15 of 28 positions shown; findings below may reference images not displayed]

FINDINGS: Intrauterine gestational sac: Single

Yolk sac:  None

Embryo:  None

Cardiac Activity: None

Heart Rate: N/A bpm

MSD: 4.0 mm   5 w   1 d

Subchorionic hemorrhage:  None visualized.

Normal right ovary.  Corpus luteum cyst left ovary.

Maternal uterus/adnexae:

Normal right ovary.

Corpus luteum cyst left ovary.

Small amount of free pelvic fluid.
IMPRESSION: 1. Small intrauterine gestational sac estimated at 5 weeks and 1
days gestation but no yolk sac, fetal pole, or cardiac activity yet
visualized. Recommend follow-up quantitative B-HCG levels and
follow-up US in 14 days to assess viability. This recommendation
follows SRU consensus guidelines: Diagnostic Criteria for Nonviable
Pregnancy Early in the First Trimester. N Engl J Med 6684;
2. Corpus luteum cyst left ovary.
3. Small amount of free pelvic fluid.
